# Patient Record
Sex: Female | Born: 1960 | Race: White | Hispanic: No | Marital: Married | Smoking: Never smoker
Health system: Southern US, Community
[De-identification: ages and names within clinical notes are randomized; demographics above are authoritative.]

## PROBLEM LIST (undated history)

## (undated) DIAGNOSIS — N951 Menopausal and female climacteric states: Secondary | ICD-10-CM

## (undated) DIAGNOSIS — I10 Essential (primary) hypertension: Secondary | ICD-10-CM

## (undated) DIAGNOSIS — J189 Pneumonia, unspecified organism: Secondary | ICD-10-CM

## (undated) DIAGNOSIS — K559 Vascular disorder of intestine, unspecified: Secondary | ICD-10-CM

## (undated) DIAGNOSIS — A609 Anogenital herpesviral infection, unspecified: Secondary | ICD-10-CM

## (undated) DIAGNOSIS — M549 Dorsalgia, unspecified: Secondary | ICD-10-CM

## (undated) DIAGNOSIS — M1710 Unilateral primary osteoarthritis, unspecified knee: Secondary | ICD-10-CM

## (undated) DIAGNOSIS — R87619 Unspecified abnormal cytological findings in specimens from cervix uteri: Secondary | ICD-10-CM

## (undated) DIAGNOSIS — E785 Hyperlipidemia, unspecified: Secondary | ICD-10-CM

## (undated) DIAGNOSIS — M1990 Unspecified osteoarthritis, unspecified site: Secondary | ICD-10-CM

## (undated) DIAGNOSIS — IMO0002 Reserved for concepts with insufficient information to code with codable children: Secondary | ICD-10-CM

## (undated) DIAGNOSIS — J309 Allergic rhinitis, unspecified: Secondary | ICD-10-CM

## (undated) DIAGNOSIS — A64 Unspecified sexually transmitted disease: Secondary | ICD-10-CM

## (undated) HISTORY — DX: Hyperlipidemia, unspecified: E78.5

## (undated) HISTORY — DX: Dorsalgia, unspecified: M54.9

## (undated) HISTORY — DX: Unilateral primary osteoarthritis, unspecified knee: M17.10

## (undated) HISTORY — DX: Anogenital herpesviral infection, unspecified: A60.9

## (undated) HISTORY — DX: Reserved for concepts with insufficient information to code with codable children: IMO0002

## (undated) HISTORY — DX: Pneumonia, unspecified organism: J18.9

## (undated) HISTORY — DX: Unspecified osteoarthritis, unspecified site: M19.90

## (undated) HISTORY — DX: Essential (primary) hypertension: I10

## (undated) HISTORY — DX: Unspecified abnormal cytological findings in specimens from cervix uteri: R87.619

## (undated) HISTORY — DX: Unspecified sexually transmitted disease: A64

## (undated) HISTORY — DX: Allergic rhinitis, unspecified: J30.9

## (undated) HISTORY — DX: Vascular disorder of intestine, unspecified: K55.9

## (undated) HISTORY — DX: Menopausal and female climacteric states: N95.1

---

## 1999-04-01 ENCOUNTER — Other Ambulatory Visit: Admission: RE | Admit: 1999-04-01 | Discharge: 1999-04-01 | Payer: Self-pay | Admitting: Obstetrics and Gynecology

## 2000-03-31 ENCOUNTER — Other Ambulatory Visit: Admission: RE | Admit: 2000-03-31 | Discharge: 2000-03-31 | Payer: Self-pay | Admitting: *Deleted

## 2001-03-10 ENCOUNTER — Other Ambulatory Visit: Admission: RE | Admit: 2001-03-10 | Discharge: 2001-03-10 | Payer: Self-pay | Admitting: *Deleted

## 2002-03-21 ENCOUNTER — Other Ambulatory Visit: Admission: RE | Admit: 2002-03-21 | Discharge: 2002-03-21 | Payer: Self-pay | Admitting: Obstetrics and Gynecology

## 2003-03-23 ENCOUNTER — Other Ambulatory Visit: Admission: RE | Admit: 2003-03-23 | Discharge: 2003-03-23 | Payer: Self-pay | Admitting: Obstetrics and Gynecology

## 2003-04-25 ENCOUNTER — Encounter: Admission: RE | Admit: 2003-04-25 | Discharge: 2003-04-25 | Payer: Self-pay | Admitting: Obstetrics and Gynecology

## 2003-09-20 ENCOUNTER — Other Ambulatory Visit: Admission: RE | Admit: 2003-09-20 | Discharge: 2003-09-20 | Payer: Self-pay | Admitting: Obstetrics and Gynecology

## 2004-01-15 ENCOUNTER — Ambulatory Visit: Payer: Self-pay | Admitting: Internal Medicine

## 2004-01-24 ENCOUNTER — Ambulatory Visit: Payer: Self-pay | Admitting: Internal Medicine

## 2004-02-26 ENCOUNTER — Ambulatory Visit: Payer: Self-pay | Admitting: Internal Medicine

## 2004-03-27 ENCOUNTER — Other Ambulatory Visit: Admission: RE | Admit: 2004-03-27 | Discharge: 2004-03-27 | Payer: Self-pay | Admitting: Obstetrics and Gynecology

## 2004-04-25 ENCOUNTER — Encounter: Admission: RE | Admit: 2004-04-25 | Discharge: 2004-04-25 | Payer: Self-pay | Admitting: Obstetrics and Gynecology

## 2004-07-07 ENCOUNTER — Ambulatory Visit: Payer: Self-pay | Admitting: Internal Medicine

## 2004-07-17 ENCOUNTER — Ambulatory Visit: Payer: Self-pay

## 2004-10-06 ENCOUNTER — Other Ambulatory Visit: Admission: RE | Admit: 2004-10-06 | Discharge: 2004-10-06 | Payer: Self-pay | Admitting: Obstetrics and Gynecology

## 2004-10-08 ENCOUNTER — Ambulatory Visit: Payer: Self-pay | Admitting: Internal Medicine

## 2004-12-25 ENCOUNTER — Ambulatory Visit: Payer: Self-pay | Admitting: Internal Medicine

## 2005-01-07 ENCOUNTER — Ambulatory Visit: Payer: Self-pay | Admitting: Internal Medicine

## 2005-03-30 ENCOUNTER — Other Ambulatory Visit: Admission: RE | Admit: 2005-03-30 | Discharge: 2005-03-30 | Payer: Self-pay | Admitting: Obstetrics and Gynecology

## 2005-04-13 ENCOUNTER — Encounter: Admission: RE | Admit: 2005-04-13 | Discharge: 2005-04-13 | Payer: Self-pay | Admitting: Obstetrics and Gynecology

## 2005-07-28 ENCOUNTER — Ambulatory Visit: Payer: Self-pay | Admitting: Internal Medicine

## 2005-08-12 ENCOUNTER — Ambulatory Visit: Payer: Self-pay | Admitting: Internal Medicine

## 2006-01-18 ENCOUNTER — Ambulatory Visit: Payer: Self-pay | Admitting: Internal Medicine

## 2006-01-19 ENCOUNTER — Ambulatory Visit: Payer: Self-pay | Admitting: Internal Medicine

## 2006-01-19 LAB — CONVERTED CEMR LAB
ALT: 22 U/L
AST: 21 U/L
Albumin: 3.7 g/dL
Alkaline Phosphatase: 71 U/L
BUN: 8 mg/dL
Basophils Absolute: 0 10*3/uL
Basophils Relative: 0.7 %
Bilirubin Urine: NEGATIVE
CO2: 30 meq/L
Calcium: 8.9 mg/dL
Chloride: 101 meq/L
Chol/HDL Ratio, serum: 3
Cholesterol: 157 mg/dL
Creatinine, Ser: 1 mg/dL
Eosinophil percent: 2.8 %
GFR calc non Af Amer: 63 mL/min
Glomerular Filtration Rate, Af Am: 76 mL/min/{1.73_m2}
Glucose, Bld: 93 mg/dL
HCT: 38.7 %
HDL: 53.1 mg/dL
Hemoglobin, Urine: NEGATIVE
Hemoglobin: 13.3 g/dL
Ketones, ur: NEGATIVE mg/dL
LDL Cholesterol: 92 mg/dL
Leukocytes, UA: NEGATIVE
Lymphocytes Relative: 22 %
MCHC: 34.3 g/dL
MCV: 91.9 fL
Monocytes Absolute: 1.1 10*3/uL — ABNORMAL HIGH
Monocytes Relative: 15.4 % — ABNORMAL HIGH
Neutro Abs: 4.1 10*3/uL
Neutrophils Relative %: 59.1 %
Nitrite: NEGATIVE
Platelets: 270 10*3/uL
Potassium: 4.1 meq/L
RBC: 4.21 M/uL
RDW: 12.7 %
Sodium: 138 meq/L
Specific Gravity, Urine: <=1.005
TSH: 1.08 u[IU]/mL
Total Bilirubin: 0.7 mg/dL
Total Protein, Urine: NEGATIVE mg/dL
Total Protein: 6.6 g/dL
Triglyceride fasting, serum: 61 mg/dL
Urine Glucose: NEGATIVE mg/dL
Urobilinogen, UA: 0.2
VLDL: 12 mg/dL
WBC: 6.9 10*3/uL
pH: 7

## 2006-04-01 ENCOUNTER — Other Ambulatory Visit: Admission: RE | Admit: 2006-04-01 | Discharge: 2006-04-01 | Payer: Self-pay | Admitting: Obstetrics & Gynecology

## 2006-04-13 LAB — HM MAMMOGRAPHY: HM Mammogram: NORMAL

## 2006-04-16 ENCOUNTER — Encounter: Admission: RE | Admit: 2006-04-16 | Discharge: 2006-04-16 | Payer: Self-pay

## 2006-08-19 ENCOUNTER — Ambulatory Visit: Payer: Self-pay | Admitting: Internal Medicine

## 2006-08-26 ENCOUNTER — Ambulatory Visit: Payer: Self-pay | Admitting: Internal Medicine

## 2006-09-18 ENCOUNTER — Emergency Department (HOSPITAL_COMMUNITY): Admission: EM | Admit: 2006-09-18 | Discharge: 2006-09-18 | Payer: Self-pay | Admitting: Family Medicine

## 2006-11-02 ENCOUNTER — Other Ambulatory Visit: Admission: RE | Admit: 2006-11-02 | Discharge: 2006-11-02 | Payer: Self-pay | Admitting: Obstetrics & Gynecology

## 2006-11-19 ENCOUNTER — Other Ambulatory Visit: Admission: RE | Admit: 2006-11-19 | Discharge: 2006-11-19 | Payer: Self-pay | Admitting: Obstetrics and Gynecology

## 2007-02-02 ENCOUNTER — Telehealth: Payer: Self-pay | Admitting: Internal Medicine

## 2007-03-08 ENCOUNTER — Ambulatory Visit: Payer: Self-pay | Admitting: Internal Medicine

## 2007-03-08 LAB — CONVERTED CEMR LAB
AST: 18 units/L (ref 0–37)
BUN: 8 mg/dL (ref 6–23)
Bilirubin Urine: NEGATIVE
Bilirubin, Direct: 0.1 mg/dL (ref 0.0–0.3)
CO2: 31 meq/L (ref 19–32)
Chloride: 102 meq/L (ref 96–112)
Cholesterol: 166 mg/dL (ref 0–200)
Eosinophils Relative: 1.8 % (ref 0.0–5.0)
Glucose, Bld: 96 mg/dL (ref 70–99)
Hemoglobin, Urine: NEGATIVE
LDL Cholesterol: 103 mg/dL — ABNORMAL HIGH (ref 0–99)
Leukocytes, UA: NEGATIVE
Lymphocytes Relative: 33.3 % (ref 12.0–46.0)
MCHC: 34.9 g/dL (ref 30.0–36.0)
MCV: 91.6 fL (ref 78.0–100.0)
Nitrite: NEGATIVE
Platelets: 277 10*3/uL (ref 150–400)
RBC: 4.1 M/uL (ref 3.87–5.11)
RDW: 13 % (ref 11.5–14.6)
Sodium: 140 meq/L (ref 135–145)
Specific Gravity, Urine: 1.01 (ref 1.000–1.03)
TSH: 0.81 microintl units/mL (ref 0.35–5.50)
Total Bilirubin: 0.6 mg/dL (ref 0.3–1.2)
Total CHOL/HDL Ratio: 3.2
Total Protein: 6.4 g/dL (ref 6.0–8.3)
Triglycerides: 59 mg/dL (ref 0–149)
Urine Glucose: NEGATIVE mg/dL
Urobilinogen, UA: 0.2 (ref 0.0–1.0)
WBC: 5.9 10*3/uL (ref 4.5–10.5)
pH: 7 (ref 5.0–8.0)

## 2007-03-16 ENCOUNTER — Ambulatory Visit: Payer: Self-pay | Admitting: Internal Medicine

## 2007-03-16 DIAGNOSIS — I10 Essential (primary) hypertension: Secondary | ICD-10-CM

## 2007-03-16 DIAGNOSIS — J309 Allergic rhinitis, unspecified: Secondary | ICD-10-CM

## 2007-03-16 HISTORY — DX: Essential (primary) hypertension: I10

## 2007-03-16 HISTORY — DX: Allergic rhinitis, unspecified: J30.9

## 2007-04-20 ENCOUNTER — Encounter: Admission: RE | Admit: 2007-04-20 | Discharge: 2007-04-20 | Payer: Self-pay | Admitting: Obstetrics and Gynecology

## 2007-04-29 ENCOUNTER — Encounter: Admission: RE | Admit: 2007-04-29 | Discharge: 2007-04-29 | Payer: Self-pay | Admitting: Obstetrics and Gynecology

## 2007-05-12 ENCOUNTER — Other Ambulatory Visit: Admission: RE | Admit: 2007-05-12 | Discharge: 2007-05-12 | Payer: Self-pay | Admitting: Obstetrics & Gynecology

## 2007-08-05 ENCOUNTER — Ambulatory Visit: Payer: Self-pay | Admitting: Internal Medicine

## 2007-08-05 DIAGNOSIS — M549 Dorsalgia, unspecified: Secondary | ICD-10-CM | POA: Insufficient documentation

## 2007-08-05 DIAGNOSIS — J069 Acute upper respiratory infection, unspecified: Secondary | ICD-10-CM | POA: Insufficient documentation

## 2007-08-05 HISTORY — DX: Dorsalgia, unspecified: M54.9

## 2007-10-18 ENCOUNTER — Ambulatory Visit: Payer: Self-pay | Admitting: Internal Medicine

## 2008-01-16 ENCOUNTER — Other Ambulatory Visit: Admission: RE | Admit: 2008-01-16 | Discharge: 2008-01-16 | Payer: Self-pay | Admitting: Obstetrics and Gynecology

## 2008-02-27 ENCOUNTER — Ambulatory Visit: Payer: Self-pay | Admitting: Internal Medicine

## 2008-03-12 ENCOUNTER — Telehealth (INDEPENDENT_AMBULATORY_CARE_PROVIDER_SITE_OTHER): Payer: Self-pay | Admitting: *Deleted

## 2008-03-21 ENCOUNTER — Ambulatory Visit: Payer: Self-pay | Admitting: Internal Medicine

## 2008-03-21 ENCOUNTER — Telehealth (INDEPENDENT_AMBULATORY_CARE_PROVIDER_SITE_OTHER): Payer: Self-pay | Admitting: *Deleted

## 2008-03-21 DIAGNOSIS — J209 Acute bronchitis, unspecified: Secondary | ICD-10-CM | POA: Insufficient documentation

## 2008-03-21 LAB — CONVERTED CEMR LAB
BUN: 10 mg/dL (ref 6–23)
Basophils Relative: 0 % (ref 0.0–3.0)
Bilirubin Urine: NEGATIVE
Bilirubin, Direct: 0.1 mg/dL (ref 0.0–0.3)
Calcium: 9.5 mg/dL (ref 8.4–10.5)
Chloride: 102 meq/L (ref 96–112)
Cholesterol: 210 mg/dL (ref 0–200)
Creatinine, Ser: 0.7 mg/dL (ref 0.4–1.2)
Eosinophils Relative: 0.8 % (ref 0.0–5.0)
GFR calc Af Amer: 114 mL/min
GFR calc non Af Amer: 95 mL/min
HCT: 38.6 % (ref 36.0–46.0)
HDL: 57.4 mg/dL (ref >39.0)
Hemoglobin, Urine: NEGATIVE
Hemoglobin: 13.5 g/dL (ref 12.0–15.0)
Ketones, ur: NEGATIVE mg/dL
Leukocytes, UA: NEGATIVE
Lymphocytes Relative: 13.2 % (ref 12.0–46.0)
Monocytes Relative: 3.9 % (ref 3.0–12.0)
Neutro Abs: 9 10*3/uL — ABNORMAL HIGH (ref 1.4–7.7)
Nitrite: NEGATIVE
Platelets: 362 10*3/uL (ref 150–400)
TSH: 0.62 microintl units/mL (ref 0.35–5.50)
Total Protein, Urine: NEGATIVE mg/dL
Total Protein: 7.5 g/dL (ref 6.0–8.3)
Triglycerides: 83 mg/dL (ref 0–149)
Urine Glucose: NEGATIVE mg/dL
Urobilinogen, UA: 0.2 (ref 0.0–1.0)
VLDL: 17 mg/dL (ref 0–40)

## 2008-03-22 LAB — CONVERTED CEMR LAB: Vit D, 1,25-Dihydroxy: 41 (ref 30–89)

## 2008-04-04 ENCOUNTER — Telehealth (INDEPENDENT_AMBULATORY_CARE_PROVIDER_SITE_OTHER): Payer: Self-pay | Admitting: *Deleted

## 2008-04-30 ENCOUNTER — Encounter: Admission: RE | Admit: 2008-04-30 | Discharge: 2008-04-30 | Payer: Self-pay | Admitting: Obstetrics and Gynecology

## 2008-05-25 HISTORY — PX: INTRAUTERINE DEVICE INSERTION: SHX323

## 2008-06-07 ENCOUNTER — Telehealth (INDEPENDENT_AMBULATORY_CARE_PROVIDER_SITE_OTHER): Payer: Self-pay | Admitting: *Deleted

## 2008-06-25 ENCOUNTER — Other Ambulatory Visit: Admission: RE | Admit: 2008-06-25 | Discharge: 2008-06-25 | Payer: Self-pay | Admitting: Gynecology

## 2008-07-02 ENCOUNTER — Ambulatory Visit: Payer: Self-pay | Admitting: Internal Medicine

## 2009-02-25 ENCOUNTER — Ambulatory Visit: Payer: Self-pay | Admitting: Internal Medicine

## 2009-02-25 LAB — CONVERTED CEMR LAB
Alkaline Phosphatase: 35 units/L — ABNORMAL LOW (ref 39–117)
Basophils Absolute: 0 10*3/uL (ref 0.0–0.1)
Basophils Relative: 0.6 % (ref 0.0–3.0)
Bilirubin, Direct: 0.1 mg/dL (ref 0.0–0.3)
CO2: 28 meq/L (ref 19–32)
Chloride: 102 meq/L (ref 96–112)
Creatinine, Ser: 0.8 mg/dL (ref 0.4–1.2)
Eosinophils Absolute: 0.2 10*3/uL (ref 0.0–0.7)
Glucose, Bld: 84 mg/dL (ref 70–99)
HCT: 40.5 % (ref 36.0–46.0)
HDL: 62.5 mg/dL (ref >39.00)
Hemoglobin, Urine: NEGATIVE
Leukocytes, UA: NEGATIVE
Monocytes Relative: 8.6 % (ref 3.0–12.0)
Platelets: 209 10*3/uL (ref 150.0–400.0)
Potassium: 4.2 meq/L (ref 3.5–5.1)
RBC: 4.25 M/uL (ref 3.87–5.11)
RDW: 13.3 % (ref 11.5–14.6)
Sodium: 137 meq/L (ref 135–145)
Total CHOL/HDL Ratio: 3
Urobilinogen, UA: 0.2 (ref 0.0–1.0)
VLDL: 8 mg/dL (ref 0.0–40.0)
pH: 6 (ref 5.0–8.0)

## 2009-03-11 ENCOUNTER — Telehealth: Payer: Self-pay | Admitting: Internal Medicine

## 2009-03-11 ENCOUNTER — Ambulatory Visit: Payer: Self-pay | Admitting: Internal Medicine

## 2009-03-11 DIAGNOSIS — IMO0002 Reserved for concepts with insufficient information to code with codable children: Secondary | ICD-10-CM

## 2009-03-11 DIAGNOSIS — M1710 Unilateral primary osteoarthritis, unspecified knee: Secondary | ICD-10-CM

## 2009-03-11 HISTORY — DX: Reserved for concepts with insufficient information to code with codable children: IMO0002

## 2009-04-17 ENCOUNTER — Encounter (INDEPENDENT_AMBULATORY_CARE_PROVIDER_SITE_OTHER): Payer: Self-pay | Admitting: *Deleted

## 2009-05-02 ENCOUNTER — Encounter: Admission: RE | Admit: 2009-05-02 | Discharge: 2009-05-02 | Payer: Self-pay | Admitting: Obstetrics and Gynecology

## 2009-05-16 ENCOUNTER — Encounter (INDEPENDENT_AMBULATORY_CARE_PROVIDER_SITE_OTHER): Payer: Self-pay | Admitting: *Deleted

## 2009-05-17 ENCOUNTER — Ambulatory Visit: Payer: Self-pay | Admitting: Gastroenterology

## 2009-05-27 ENCOUNTER — Ambulatory Visit: Payer: Self-pay | Admitting: Internal Medicine

## 2009-05-31 ENCOUNTER — Ambulatory Visit: Payer: Self-pay | Admitting: Gastroenterology

## 2009-05-31 LAB — HM COLONOSCOPY

## 2009-10-15 ENCOUNTER — Ambulatory Visit: Payer: Self-pay | Admitting: Internal Medicine

## 2009-10-15 DIAGNOSIS — J019 Acute sinusitis, unspecified: Secondary | ICD-10-CM

## 2009-11-19 ENCOUNTER — Telehealth: Payer: Self-pay | Admitting: Internal Medicine

## 2009-12-17 ENCOUNTER — Ambulatory Visit: Payer: Self-pay | Admitting: Internal Medicine

## 2009-12-17 DIAGNOSIS — L259 Unspecified contact dermatitis, unspecified cause: Secondary | ICD-10-CM | POA: Insufficient documentation

## 2009-12-19 ENCOUNTER — Ambulatory Visit: Payer: Self-pay | Admitting: Internal Medicine

## 2009-12-19 ENCOUNTER — Telehealth: Payer: Self-pay | Admitting: Internal Medicine

## 2009-12-27 ENCOUNTER — Telehealth: Payer: Self-pay | Admitting: Internal Medicine

## 2009-12-27 DIAGNOSIS — R21 Rash and other nonspecific skin eruption: Secondary | ICD-10-CM | POA: Insufficient documentation

## 2010-02-21 ENCOUNTER — Ambulatory Visit: Payer: Self-pay | Admitting: Internal Medicine

## 2010-02-25 ENCOUNTER — Telehealth: Payer: Self-pay | Admitting: Internal Medicine

## 2010-03-10 ENCOUNTER — Ambulatory Visit: Payer: Self-pay | Admitting: Internal Medicine

## 2010-03-10 DIAGNOSIS — I10 Essential (primary) hypertension: Secondary | ICD-10-CM

## 2010-03-12 ENCOUNTER — Other Ambulatory Visit: Payer: Self-pay | Admitting: Internal Medicine

## 2010-03-12 LAB — URINALYSIS, ROUTINE W REFLEX MICROSCOPIC
Bilirubin Urine: NEGATIVE
Hemoglobin, Urine: NEGATIVE
Ketones, ur: NEGATIVE
Nitrite: NEGATIVE
Specific Gravity, Urine: <=1.005 (ref 1.000–1.030)
Total Protein, Urine: NEGATIVE
Urine Glucose: NEGATIVE
Urobilinogen, UA: 0.2 (ref 0.0–1.0)
pH: 6.5 (ref 5.0–8.0)

## 2010-03-12 LAB — B12 AND FOLATE PANEL
Folate: 18.8 ng/mL
Vitamin B-12: >1500 pg/mL — ABNORMAL HIGH (ref 211–911)

## 2010-03-12 LAB — CBC WITH DIFFERENTIAL/PLATELET
Basophils Absolute: 0 10*3/uL (ref 0.0–0.1)
Basophils Relative: 0.5 % (ref 0.0–3.0)
Eosinophils Absolute: 0.2 10*3/uL (ref 0.0–0.7)
Eosinophils Relative: 3.1 % (ref 0.0–5.0)
HCT: 38.1 % (ref 36.0–46.0)
Hemoglobin: 13.1 g/dL (ref 12.0–15.0)
Lymphocytes Relative: 33.2 % (ref 12.0–46.0)
Lymphs Abs: 1.6 10*3/uL (ref 0.7–4.0)
MCHC: 34.5 g/dL (ref 30.0–36.0)
MCV: 92.3 fl (ref 78.0–100.0)
Monocytes Absolute: 0.5 10*3/uL (ref 0.1–1.0)
Monocytes Relative: 9.7 % (ref 3.0–12.0)
Neutro Abs: 2.7 10*3/uL (ref 1.4–7.7)
Neutrophils Relative %: 53.5 % (ref 43.0–77.0)
Platelets: 297 10*3/uL (ref 150.0–400.0)
RBC: 4.13 Mil/uL (ref 3.87–5.11)
RDW: 13.8 % (ref 11.5–14.6)
WBC: 5 10*3/uL (ref 4.5–10.5)

## 2010-03-12 LAB — BASIC METABOLIC PANEL
BUN: 14 mg/dL (ref 6–23)
CO2: 30 mEq/L (ref 19–32)
Calcium: 9.1 mg/dL (ref 8.4–10.5)
Chloride: 101 mEq/L (ref 96–112)
Creatinine, Ser: 0.7 mg/dL (ref 0.4–1.2)
GFR: 89.23 mL/min (ref >60.00)
Glucose, Bld: 78 mg/dL (ref 70–99)
Potassium: 4.2 mEq/L (ref 3.5–5.1)
Sodium: 138 mEq/L (ref 135–145)

## 2010-03-12 LAB — HEPATIC FUNCTION PANEL
ALT: 25 U/L (ref 0–35)
AST: 23 U/L (ref 0–37)
Albumin: 3.8 g/dL (ref 3.5–5.2)
Alkaline Phosphatase: 50 U/L (ref 39–117)
Bilirubin, Direct: 0.1 mg/dL (ref 0.0–0.3)
Total Bilirubin: 0.8 mg/dL (ref 0.3–1.2)
Total Protein: 6.7 g/dL (ref 6.0–8.3)

## 2010-03-12 LAB — LIPID PANEL
Cholesterol: 171 mg/dL (ref 0–200)
HDL: 43.4 mg/dL (ref >39.00)
LDL Cholesterol: 121 mg/dL — ABNORMAL HIGH (ref 0–99)
Total CHOL/HDL Ratio: 4
Triglycerides: 34 mg/dL (ref 0.0–149.0)
VLDL: 6.8 mg/dL (ref 0.0–40.0)

## 2010-03-12 LAB — TSH: TSH: 0.51 u[IU]/mL (ref 0.35–5.50)

## 2010-03-17 ENCOUNTER — Encounter: Payer: Self-pay | Admitting: Internal Medicine

## 2010-03-17 ENCOUNTER — Ambulatory Visit
Admission: RE | Admit: 2010-03-17 | Discharge: 2010-03-17 | Payer: Self-pay | Source: Home / Self Care | Attending: Internal Medicine | Admitting: Internal Medicine

## 2010-03-17 DIAGNOSIS — E785 Hyperlipidemia, unspecified: Secondary | ICD-10-CM | POA: Insufficient documentation

## 2010-03-17 HISTORY — DX: Hyperlipidemia, unspecified: E78.5

## 2010-03-30 ENCOUNTER — Encounter: Payer: Self-pay | Admitting: Obstetrics and Gynecology

## 2010-04-10 NOTE — Progress Notes (Signed)
  Phone Note Call from Patient Call back at Work Phone [phone]   Caller: Patient Summary of Call: Pt c/o sinus congestion and pain. She states she is taking allergy medication and also Benadryl with no relieft. She is wondering if something can be called in for her since she is having the same symptoms as last OV (10/15/09) so she doesn't have to come in. Initial call taken by: Brenton Grills MA,  November 19, 2009 10:25 AM  Follow-up for Phone Call        ok this time = done per emr Follow-up by: Corwin Levins MD,  November 19, 2009 1:23 PM  Additional Follow-up for Phone Call Additional follow up Details #1::        pt informed Additional Follow-up by: Brenton Grills MA,  November 19, 2009 1:30 PM    New/Updated Medications: CLARITHROMYCIN 500 MG TABS (CLARITHROMYCIN) 1 by mouth two times a day Prescriptions: CLARITHROMYCIN 500 MG TABS (CLARITHROMYCIN) 1 by mouth two times a day  #20 x 0   Entered and Authorized by:   Corwin Levins MD   Signed by:   Corwin Levins MD on 11/19/2009   Method used:   Electronically to        [facility]  [address] # [id number]* (retail)       [address]       [place], [place]  [address]       Ph: [phone] or [phone]       Fax: [phone]   RxID:   [id number]

## 2010-04-10 NOTE — Progress Notes (Signed)
  Phone Note Refill Request   Refills Requested: Medication #1:  FEXOFENADINE HCL 180 MG TABS 1po once daily as needed allergy   Dosage confirmed as above?Dosage Confirmed   Notes: [facility] Initial call taken by: Scharlene Gloss,  March 11, 2009 12:11 PM    Prescriptions: FEXOFENADINE HCL 180 MG TABS (FEXOFENADINE HCL) 1po once daily as needed allergy  #90 x 3   Entered by:   Scharlene Gloss   Authorized by:   Corwin Levins MD   Signed by:   Scharlene Gloss on 03/11/2009   Method used:   Faxed to ...       [facility]* (mail-order)             ,          Ph: [phone]       Fax: [phone]   RxID:   [id number]

## 2010-04-10 NOTE — Miscellaneous (Signed)
Summary: [facility]  Clinical Lists Changes  Medications: Added new medication of MOVIPREP 100 GM  SOLR (PEG-KCL-NACL-NASULF-NA ASC-C) As per prep instructions. - Signed Rx of MOVIPREP 100 GM  SOLR (PEG-KCL-NACL-NASULF-NA ASC-C) As per prep instructions.;  #1 x 0;  Signed;  Entered by: Ezra Sites RN;  Authorized by: Mardella Layman MD [facility];  Method used: Electronically to [facility] [address] # [id number]*, [address] [place], [place], [place]  [address], Ph: [phone] or [phone], Fax: [phone] Allergies: Added new allergy or adverse reaction of SEPTRA    Prescriptions: MOVIPREP 100 GM  SOLR (PEG-KCL-NACL-NASULF-NA ASC-C) As per prep instructions.  #1 x 0   Entered by:   Ezra Sites RN   Authorized by:   Mardella Layman MD [facility]   Signed by:   Ezra Sites RN on 05/17/2009   Method used:   Electronically to        [facility] [address] # [facility]* (retail)       [address]       [place], [place]  [address]       Ph: [phone] or [phone]       Fax: [phone]   RxID:   [id number]

## 2010-04-10 NOTE — Assessment & Plan Note (Signed)
Summary: CPX/[facility]/#/CD   Vital Signs:  Patient profile:   50 year old female Height:      65 inches Weight:      151 pounds BMI:     25.22 O2 Sat:      98 % on Room air Temp:     96.9 degrees F oral Pulse rate:   74 / minute BP sitting:   122 / 74  (left arm) Cuff size:   regular  Vitals Entered ByMarland Kitchen Zella Ball Ewing (March 11, 2009 8:33 AM)  O2 Flow:  Room air  CC: adult physical/Re   Primary Care Provider:  Corwin Levins MD  CC:  adult physical/Re.  History of Present Illness: overall doing very well, has gained just a few lbs in the last 2 months with somewhat less active than usual, but Pt denies CP, sob, doe, wheezing, orthopnea, pnd, worsening LE edema, palps, dizziness or syncope   Pt denies new neuro symptoms such as headache, facial or extremity weakness   Overall good compliance with meds, and tolerating well.    Problems Prior to Update: 1)  Asthmatic Bronchitis, Acute  (ICD-466.0) 2)  Preventive Health Care  (ICD-V70.0) 3)  Back Pain  (ICD-724.5) 4)  Uri  (ICD-465.9) 5)  Hypertension  (ICD-401.9) 6)  Allergic Rhinitis  (ICD-477.9) 7)  Preventive Health Care  (ICD-V70.0) 8)  Routine General Medical Exam@health  Care Facl  (ICD-V70.0)  Medications Prior to Update: 1)  Benicar 40 Mg Tabs (Olmesartan Medoxomil) .... Once Daily 2)  Estrace 1 Mg  Tabs (Estradiol) .Marland Kitchen.. 1 By Mouth Qd 3)  Ecotrin Low Strength 81 Mg  Tbec (Aspirin) .Marland Kitchen.. 1 By Mouth Qd 4)  Daily Multiple Vitamins   Tabs (Multiple Vitamin) .Marland Kitchen.. 1 By Mouth Qd 5)  Cetirizine Hcl 10 Mg  Tabs (Cetirizine Hcl) .Marland Kitchen.. 1 By Mouth Once Daily 6)  Nasonex 50 Mcg/act Susp (Mometasone Furoate) .... 2 Puffs Each Nostril Once Daily  Current Medications (verified): 1)  Benicar 40 Mg Tabs (Olmesartan Medoxomil) .... 1/2 Once Daily 2)  Estrace 1 Mg  Tabs (Estradiol) .Marland Kitchen.. 1 By Mouth Qd 3)  Ecotrin Low Strength 81 Mg  Tbec (Aspirin) .Marland Kitchen.. 1 By Mouth Qd 4)  Daily Multiple Vitamins   Tabs (Multiple Vitamin) .Marland Kitchen.. 1 By Mouth  Qd 5)  Fexofenadine Hcl 180 Mg Tabs (Fexofenadine Hcl) .Marland Kitchen.. 1po Once Daily As Needed Allergy 6)  Nasonex 50 Mcg/act Susp (Mometasone Furoate) .... 2 Puffs Each Nostril Once Daily 7)  Glucosamine 500 Mg Caps (Glucosamine Sulfate) .Marland Kitchen.. 1 By Mouth Once Daily  Allergies (verified): 1)  ! Sulfa  Past History:  Past Surgical History: Last updated: 03/16/2007 Denies surgical history  Family History: Last updated: 03/16/2007 sister with brain tumor 3 sister with HTN mother with heart murmur, DM mother with glaucoma  Social History: Last updated: 03/16/2007 Never Smoked Alcohol use-yes Married i child work -Building control surveyor for [employer]  Risk Factors: Smoking Status: never (03/16/2007)  Past Medical History: Allergic rhinitis migraine menopausal since 50yo Hypertension  Review of Systems  The patient denies anorexia, fever, weight loss, weight gain, vision loss, decreased hearing, hoarseness, chest pain, syncope, dyspnea on exertion, peripheral edema, prolonged cough, headaches, hemoptysis, abdominal pain, melena, hematochezia, severe indigestion/heartburn, hematuria, incontinence, muscle weakness, suspicious skin lesions, difficulty walking, depression, unusual weight change, abnormal bleeding, enlarged lymph nodes, and angioedema.         all otherwise negative per pt , has some occainal knee joint popping, and left ear eustachan valve  symtpom with pressure type discomfort wihtout hearing loss or vertigo; used to take allegra  Physical Exam  General:  alert and well-developed.   Head:  normocephalic and atraumatic.   Eyes:  vision grossly intact and pupils equal.   Ears:  left tm mild erythema, non bulging, right TM ok Nose:  nasal dischargemucosal pallor and mucosal erythema.   Mouth:  no gingival abnormalities and pharynx pink and moist.   Neck:  supple and no masses.   Lungs:  normal respiratory effort and normal breath sounds.   Heart:  normal rate and regular  rhythm.   Abdomen:  soft, non-tender, and normal bowel sounds.   Msk:  no joint tenderness and no joint swelling.  except for very mild left knee warmth and crepitus ;  has normal ROM and NT, no effusion Extremities:  no edema, no erythema  Neurologic:  cranial nerves II-XII intact and strength normal in all extremities.     Impression & Recommendations:  Problem # 1:  Preventive Health Care (ICD-V70.0)  Overall doing well, up to date, counseled on routine health concerns for screening and prevention, immunizations up to date or declined, labs reviewed, ecg reviewed  Orders: EKG w/ Interpretation (93000) Gastroenterology Referral (GI)  Problem # 2:  HYPERTENSION (ICD-401.9)  Her updated medication list for this problem includes:    Benicar 40 Mg Tabs (Olmesartan medoxomil) .Marland Kitchen... 1/2 once daily stable overall by hx and exam, ok to continue meds/tx as is   Problem # 3:  ALLERGIC RHINITIS (ICD-477.9)  Her updated medication list for this problem includes:    Fexofenadine Hcl 180 Mg Tabs (Fexofenadine hcl) .Marland Kitchen... 1po once daily as needed allergy    Nasonex 50 Mcg/act Susp (Mometasone furoate) .Marland Kitchen... 2 puffs each nostril once daily treat as above, f/u any worsening signs or symptoms   Complete Medication List: 1)  Benicar 40 Mg Tabs (Olmesartan medoxomil) .... 1/2 once daily 2)  Estrace 1 Mg Tabs (Estradiol) .Marland Kitchen.. 1 by mouth qd 3)  Ecotrin Low Strength 81 Mg Tbec (Aspirin) .Marland Kitchen.. 1 by mouth qd 4)  Daily Multiple Vitamins Tabs (Multiple vitamin) .Marland Kitchen.. 1 by mouth qd 5)  Fexofenadine Hcl 180 Mg Tabs (Fexofenadine hcl) .Marland Kitchen.. 1po once daily as needed allergy 6)  Nasonex 50 Mcg/act Susp (Mometasone furoate) .... 2 puffs each nostril once daily 7)  Glucosamine 500 Mg Caps (Glucosamine sulfate) .Marland Kitchen.. 1 by mouth once daily  Other Orders: Tdap => 3yrs IM (82956) Admin 1st Vaccine (21308)  Patient Instructions: 1)  you had the tetanus shot today 2)  Please take all new medications as  prescribed 3)  Continue all previous medications as before this visit  4)  You will be contacted about the referral(s) to: Colonoscopy 5)  Please schedule a follow-up appointment in 1 year or sooner if needed Prescriptions: FEXOFENADINE HCL 180 MG TABS (FEXOFENADINE HCL) 1po once daily as needed allergy  #90 x 3   Entered and Authorized by:   Corwin Levins MD   Signed by:   Corwin Levins MD on 03/11/2009   Method used:   Print then Give to Patient   RxID:   [id number] BENICAR 40 MG TABS (OLMESARTAN MEDOXOMIL) once daily  #90 x 3   Entered and Authorized by:   Corwin Levins MD   Signed by:   Corwin Levins MD on 03/11/2009   Method used:   Print then Give to Patient   RxID:   [id number] BENICAR 40 MG TABS (OLMESARTAN  MEDOXOMIL) once daily  #90 x 3   Entered and Authorized by:   Corwin Levins MD   Signed by:   Corwin Levins MD on 03/11/2009   Method used:   Print then Give to Patient   RxID:   [id number]    Immunizations Administered:  Tetanus Vaccine:    Vaccine Type: Tdap    Site: right deltoid    Mfr: GlaxoSmithKline    Dose: 0.5 ml    Route: IM    Given by: Robin Ewing    Exp. Date: 05/04/2011    Lot #: [id number]    VIS given: 01/25/07 version given March 11, 2009.

## 2010-04-10 NOTE — Assessment & Plan Note (Signed)
Summary: SINUS HEADACHE/CD   Vital Signs:  Patient profile:   50 year old female Height:      64 inches Weight:      147 pounds BMI:     25.32 O2 Sat:      96 % on Room air Temp:     98.5 degrees F oral Pulse rate:   78 / minute BP sitting:   108 / 72  (left arm) Cuff size:   regular  Vitals Entered By: Zella Ball Ewing CMA (AAMA) (October 15, 2009 3:30 PM)  O2 Flow:  Room air CC: Sinus headache for 5 days/RE   Primary Care Provider:  Corwin Levins MD  CC:  Sinus headache for 5 days/RE.  History of Present Illness: here with acute onset moderate to severe x 5 days fever, left facial pain, pressure, fever and greenish d/c and left earache as well; no sT, cough and Pt denies CP, sob, doe, wheezing, orthopnea, pnd, worsening LE edema, palps, dizziness or syncope Pt denies new neuro symptoms such as headache, facial or extremity weakness  .  This is on top of nasal allergy symtpoms usually better with benadryl at bedtime as needed for 2 mo since she ran out of her allegra which worked well previously.  Mucinex does help somewhat in the last few days.  Overall good med compliacne as well, good tolerance.    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 1/2 Once Daily 2)  Estrace 1 Mg  Tabs (Estradiol) .Marland Kitchen.. 1 By Mouth Qd 3)  Ecotrin Low Strength 81 Mg  Tbec (Aspirin) .Marland Kitchen.. 1 By Mouth Qd 4)  Daily Multiple Vitamins   Tabs (Multiple Vitamin) .Marland Kitchen.. 1 By Mouth Qd 5)  Fexofenadine Hcl 180 Mg Tabs (Fexofenadine Hcl) .Marland Kitchen.. 1po Once Daily As  Needed Allergy 6)  Nasonex 50 Mcg/act Susp (Mometasone Furoate) .... 2 Puffs Each Nostril Once Daily 7)  Glucosamine 500 Mg Caps (Glucosamine Sulfate) .Marland Kitchen.. 1 By Mouth Once Daily 8)  Azithromycin 250 Mg Tabs (Azithromycin) .... 2po Qd For 1 Day, Then 1po Qd For 4days, Then Stop  Current Medications (verified): 1)  Benicar 40 Mg Tabs (Olmesartan Medoxomil) .... 1/2 Once Daily 2)  Estrace 1 Mg  Tabs (Estradiol) .Marland Kitchen.. 1 By Mouth Qd 3)  Ecotrin Low Strength 81 Mg  Tbec (Aspirin) .Marland Kitchen.. 1 By Mouth Qd 4)  Daily Multiple Vitamins   Tabs (Multiple Vitamin) .Marland Kitchen.. 1 By Mouth Qd 5)  Fexofenadine Hcl 180 Mg Tabs (Fexofenadine Hcl) .Marland Kitchen.. 1po Once Daily As Needed Allergy 6)  Nasonex 50 Mcg/act Susp (Mometasone Furoate) .... 2 Puffs Each Nostril Once Daily 7)  Glucosamine 500 Mg Caps (Glucosamine Sulfate) .Marland Kitchen.. 1 By Mouth Once Daily 8)  Levaquin 500 Mg Tabs (Levofloxacin) .Marland Kitchen.. 1po Once Daily  Allergies (verified): 1)  ! Sulfa 2)  ! Septra  Past History:  Past Medical History: Last updated: 03/11/2009 Allergic rhinitis migraine menopausal since 50yo Hypertension  Past Surgical History: Last updated: 03/16/2007 Denies surgical history  Social History: Last updated: 03/16/2007 Never Smoked Alcohol  use-yes Married i child work -Building control surveyor for [employer]  Risk Factors: Smoking Status: never (03/16/2007)  Review of Systems       all otherwise negative per pt -    Physical Exam  General:  alert and well-developed.   Head:  normocephalic and atraumatic.   Eyes:  vision grossly intact, pupils equal, and pupils round.   Ears:  R ear normal.  ., left tm mod erythema, sinus tender left > right with puffiness Nose:  nasal dischargemucosal pallor and mucosal edema.   Mouth:  pharyngeal erythema and fair dentition.   Neck:  supple and no masses.   Lungs:  normal respiratory effort and normal breath sounds.   Heart:  normal rate and regular rhythm.   Extremities:  no edema, no erythema      Impression & Recommendations:  Problem # 1:  SINUSITIS- ACUTE-NOS (ICD-461.9)  Her updated medication list for this problem includes:    Nasonex 50 Mcg/act Susp (Mometasone furoate) .Marland Kitchen... 2 puffs each nostril once daily    Levaquin 500 Mg Tabs (Levofloxacin) .Marland Kitchen... 1po once daily treat as above, f/u any worsening signs or symptoms   Problem # 2:  ALLERGIC RHINITIS (ICD-477.9)  Her updated medication list for this problem includes:    Fexofenadine Hcl 180 Mg Tabs (Fexofenadine hcl) .Marland Kitchen... 1po once daily as needed allergy    Nasonex 50 Mcg/act Susp (Mometasone furoate) .Marland Kitchen... 2 puffs each nostril once daily treat as above, f/u any worsening signs or symptoms  - to re-start meds  Problem # 3:  HYPERTENSION (ICD-401.9)  Her updated medication list for this problem includes:    Benicar 40 Mg Tabs (Olmesartan medoxomil) .Marland Kitchen... 1/2 once daily  BP today: 108/72 Prior BP: 118/60 (05/27/2009)  Prior 10 Yr Risk Heart Disease: Not enough information (07/02/2008)  Labs Reviewed: K+: 4.2 (02/25/2009) Creat: : 0.8 (02/25/2009)   Chol: 175 (02/25/2009)   HDL: 62.50 (02/25/2009)   LDL: 105 (02/25/2009)   TG: 40.0 (02/25/2009) stable overall by hx and exam, ok to continue meds/tx as is   Complete Medication List: 1)  Benicar 40 Mg Tabs (Olmesartan medoxomil) .... 1/2 once daily 2)  Estrace 1 Mg Tabs (Estradiol) .Marland Kitchen.. 1 by mouth qd 3)  Ecotrin Low Strength 81 Mg Tbec (Aspirin) .Marland Kitchen.. 1 by mouth qd 4)  Daily Multiple Vitamins Tabs (Multiple vitamin) .Marland Kitchen.. 1 by mouth qd 5)  Fexofenadine Hcl 180 Mg Tabs (Fexofenadine hcl) .Marland Kitchen.. 1po once daily as needed allergy 6)  Nasonex 50 Mcg/act Susp (Mometasone furoate) .... 2 puffs each nostril once daily 7)  Glucosamine 500 Mg Caps (Glucosamine sulfate) .Marland Kitchen.. 1 by mouth once daily 8)  Levaquin 500 Mg Tabs (Levofloxacin) .Marland Kitchen.. 1po once daily  Patient Instructions: 1)  Please take all new medications as prescribed 2)  Continue all previous medications as before  this visit  3)  You can also use Mucinex OTC or it's generic for congestion  4)  Please schedule a follow-up appointment as needed. Prescriptions: FEXOFENADINE HCL 180 MG TABS (FEXOFENADINE HCL) 1po once daily as needed allergy  #90 x 3   Entered and Authorized by:   Corwin Levins MD   Signed by:   Corwin Levins MD on 10/15/2009   Method used:   Print then Give to Patient   RxID:   [id number] LEVAQUIN 500 MG TABS (LEVOFLOXACIN) 1po once daily  #14 x 0   Entered and Authorized by:   Corwin Levins MD   Signed by:  Corwin Levins MD on 10/15/2009   Method used:   Print then Give to Patient   RxID:   [id number]

## 2010-04-10 NOTE — Procedures (Signed)
Summary: Colonoscopy  Patient: Alyssa Shaw Note: All result statuses are Final unless otherwise noted.  Tests: (1) Colonoscopy (COL)   COL Colonoscopy           DONE     [facility]     [address]     [place], [place]  [address]           COLONOSCOPY PROCEDURE REPORT           PATIENT:  Alyssa, Shaw  MR#:  [id number]     BIRTHDATE:  27-Dec-1960, 48 yrs. old  GENDER:  female     ENDOSCOPIST:  Vania Rea. Jarold Motto, MD, [facility]     REF. BY:  Oliver Barre, M.D.     PROCEDURE DATE:  05/31/2009     PROCEDURE:  Average-risk screening colonoscopy     G0121     ASA CLASS:  Class II     INDICATIONS:  Routine Risk Screening     MEDICATIONS:   Fentanyl 75 mcg IV, Versed 8 mg IV           DESCRIPTION OF PROCEDURE:   After the risks benefits and     alternatives of the procedure were thoroughly explained, informed     consent was obtained.  Digital rectal exam was performed and     revealed no abnormalities.   The LB [id number] endoscope     was introduced through the anus and advanced to the cecum, which     was identified by both the appendix and ileocecal valve, without     limitations.  The quality of the prep was excellent, using     MoviPrep.  The instrument was then slowly withdrawn as the colon     was fully examined.     <<PROCEDUREIMAGES>>           FINDINGS:  No polyps or cancers were seen.  This was otherwise a     normal examination of the colon.   Retroflexed views in the rectum     revealed no abnormalities.    The scope was then withdrawn from     the patient and the procedure completed.           COMPLICATIONS:  None     ENDOSCOPIC IMPRESSION:     1) No polyps or cancers     2) Otherwise normal examination     RECOMMENDATIONS:     1) Continue current colorectal screening recommendations for     "routine risk" patients with a repeat colonoscopy in 10 years.     REPEAT EXAM:  No           ______________________________     Vania Rea. Jarold Motto, MD,  Clement[facility] Graham           CC:           n.     eSIGNED:   Vania Rea. My [place]ari[place] at 05/31/2009 10:06 AM           Therisa Doyne, [id number]  Note: An exclamation mark (!) indicates a result that was not dispersed into the flowsheet. Document Creation Date: 05/31/2009 12:51 PM _______________________________________________________________________  (1) Order result status: Final Collection or observation date-time: 05/31/2009 10:02 Requested date-time:  Receipt date-time:  Reported date-time:  Referring Physician:   Ordering Physician: Sheryn Bison [phone]) Specimen Source:  Source: Launa Grill Order Number: [id number] Lab site:   Appended Document: Colonoscopy    Clinical Lists Changes  Observations: Added new observation of COLONNXTDUE:  05/2019 (05/31/2009 13:15)     

## 2010-04-10 NOTE — Assessment & Plan Note (Signed)
Summary: ?poison ivy/sumac?/cd   Vital Signs:  Patient profile:   50 year old female Height:      64 inches Weight:      160.38 pounds BMI:     27.63 O2 Sat:      96 % on Room air Temp:     98 degrees F oral Pulse rate:   70 / minute BP sitting:   118 / 78  (left arm) Cuff size:   regular  Vitals Entered By: Zella Ball Ewing CMA Duncan Dull) (December 17, 2009 3:39 PM)  O2 Flow:  Room air CC: Poison Ivy/RE   Primary Care Provider:  Corwin Levins MD  CC:  Poison Ivy/RE.  History of Present Illness: here for acute, with itchy rash to relatively small area to the right forearm and area near the left face and eye after pulling weeds in the yard over the weekend;  has occurred before doing the same thing in years past;  Pt denies CP, worsening sob, doe, wheezing, orthopnea, pnd, worsening LE edema, palps, dizziness or syncope  No fever, wt loss, night sweats, loss of appetite or other constitutional symptoms  No blurred visiion or eyepain or sweling. Pt denies new neuro symptoms such as headache, facial or extremity weakness  Pt denies polydipsia, polyuria.  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 1/2 Once Daily 2)  Estrace 1 Mg  Tabs (Estradiol) .Marland Kitchen.. 1 By Mouth Qd 3)  Ecotrin Low Strength 81 Mg  Tbec (Aspirin) .Marland Kitchen.. 1 By Mouth Qd 4)  Daily Multiple Vitamins   Tabs (Multiple Vitamin) .Marland Kitchen.. 1 By Mouth Qd 5)  Fexofenadine Hcl 180 Mg Tabs (Fexofenadine Hcl) .Marland Kitchen.. 1po Once Daily As Needed Allergy 6)  Nasonex 50 Mcg/act Susp (Mometasone  Furoate) .... 2 Puffs Each Nostril Once Daily 7)  Glucosamine 500 Mg Caps (Glucosamine Sulfate) .Marland Kitchen.. 1 By Mouth Once Daily 8)  Clarithromycin 500 Mg Tabs (Clarithromycin) .Marland Kitchen.. 1 By Mouth Two Times A Day  Current Medications (verified): 1)  Benicar 40 Mg Tabs (Olmesartan Medoxomil) .... 1/2 Once Daily 2)  Estrace 1 Mg  Tabs (Estradiol) .Marland Kitchen.. 1 By Mouth Qd 3)  Ecotrin Low Strength 81 Mg  Tbec (Aspirin) .Marland Kitchen.. 1 By Mouth Qd 4)  Daily Multiple Vitamins   Tabs (Multiple Vitamin) .Marland Kitchen.. 1 By Mouth Qd 5)  Fexofenadine Hcl 180 Mg Tabs (Fexofenadine Hcl) .Marland Kitchen.. 1po Once Daily As Needed Allergy 6)  Nasonex 50 Mcg/act Susp (Mometasone Furoate) .... 2 Puffs Each Nostril Once Daily 7)  Glucosamine 500 Mg Caps (Glucosamine Sulfate) .Marland Kitchen.. 1 By Mouth Once Daily 8)  Clarithromycin 500 Mg Tabs (Clarithromycin) .Marland Kitchen.. 1 By Mouth Two Times A Day 9)  Triamcinolone Acetonide 0.1 % Crea (Triamcinolone Acetonide) .... Use Asd Two Times A Day As Needed  Allergies (verified): 1)  ! Sulfa 2)  ! Septra  Past History:  Past Medical History: Last updated: 03/11/2009 Allergic rhinitis migraine menopausal since 50yo Hypertension  Past Surgical History: Last updated: 03/16/2007 Denies surgical history  Social History: Last updated: 03/16/2007 Never Smoked Alcohol use-yes  Married i child work -Building control surveyor for [employer]  Risk Factors: Smoking Status: never (03/16/2007)  Review of Systems       all otherwise negative per pt -    Physical Exam  General:  alert and well-developed.   Head:  normocephalic and atraumatic.   Eyes:  vision grossly intact, pupils equal, and pupils round.   Ears:  R ear normal and L ear normal.   Nose:  no external deformity and no nasal discharge.   Mouth:  no gingival abnormalities and pharynx pink and moist.   Neck:  supple and no masses.   Lungs:  normal respiratory effort and normal breath sounds.   Heart:  normal rate and regular rhythm.   Extremities:  no edema,  no erythema  Skin:  typical contat dermatitis rash to anterior right forearm and left face near the left eye but not involving this Psych:  not anxious appearing and not depressed appearing.     Impression & Recommendations:  Problem # 1:  CONTACT DERMATITIS (ICD-692.9)  Her updated medication list for this problem includes:    Fexofenadine Hcl 180 Mg Tabs (Fexofenadine hcl) .Marland Kitchen... 1po once daily as needed allergy    Triamcinolone Acetonide 0.1 % Crea (Triamcinolone acetonide) ..... Use asd two times a day as needed treat as above, f/u any worsening signs or symptoms , overall mild  Problem # 2:  HYPERTENSION (ICD-401.9)  Her updated medication list for this problem includes:    Benicar 40 Mg Tabs (Olmesartan medoxomil) .Marland Kitchen... 1/2 once daily  BP today: 118/78 Prior BP: 108/72 (10/15/2009)  Prior 10 Yr Risk Heart Disease: Not enough information (07/02/2008)  Labs Reviewed: K+: 4.2 (02/25/2009) Creat: : 0.8 (02/25/2009)   Chol: 175 (02/25/2009)   HDL: 62.50 (02/25/2009)   LDL: 105 (02/25/2009)   TG: 40.0 (02/25/2009) stable overall by hx and exam, ok to continue meds/tx as is   Complete Medication List: 1)  Benicar 40 Mg Tabs (Olmesartan medoxomil) .... 1/2 once daily 2)  Estrace 1 Mg Tabs (Estradiol) .Marland Kitchen.. 1 by mouth qd 3)  Ecotrin Low Strength 81 Mg Tbec (Aspirin) .Marland Kitchen.. 1 by mouth qd 4)  Daily Multiple Vitamins Tabs (Multiple vitamin) .Marland Kitchen.. 1 by mouth qd 5)  Fexofenadine Hcl 180 Mg Tabs (Fexofenadine hcl) .Marland Kitchen.. 1po once daily as needed allergy 6)  Nasonex 50 Mcg/act Susp (Mometasone furoate) .... 2 puffs each nostril once daily 7)  Glucosamine 500 Mg Caps (Glucosamine sulfate) .Marland Kitchen.. 1 by mouth once daily 8)  Clarithromycin 500 Mg Tabs (Clarithromycin) .Marland Kitchen.. 1 by mouth two times a day 9)  Triamcinolone Acetonide 0.1 % Crea (Triamcinolone acetonide) .... Use asd two times a day as needed  Patient Instructions: 1)  Please take all new medications as prescribed 2)  Continue all previous  medications as before this visit 3)  Please schedule a follow-up appointment in Jan 2012 with CPX labs and B12;folate:  780.79 Prescriptions: TRIAMCINOLONE ACETONIDE 0.1 % CREA (TRIAMCINOLONE ACETONIDE) use asd two times a day as needed  #1large tube x 1   Entered and Authorized by:   Corwin Levins MD   Signed by:   Corwin Levins MD on 12/17/2009   Method used:   Print then Give to Patient   RxID:   [id number]

## 2010-04-10 NOTE — Assessment & Plan Note (Signed)
Summary: BURNING/EARS/THROAT---CHEST WHEEZING---STC   Vital Signs:  Patient profile:   50 year old female Height:      64 inches Weight:      162.13 pounds BMI:     27.93 O2 Sat:      96 % on Room air Temp:     98.5 degrees F oral Pulse rate:   92 / minute BP sitting:   102 / 72  (left arm) Cuff size:   regular  Vitals Entered By: Zella Ball Ewing CMA Duncan Dull) (February 21, 2010 2:46 PM)  O2 Flow:  Room air CC: Wheezing, cough, throat and ear pain/RE   Primary Care Sammuel Blick:  Corwin Levins MD  CC:  Wheezing, cough, and throat and ear pain/RE.  History of Present Illness: here today with c/o acute onset midl to mod 3 days facial pain., pressure, fever, headache,  and greenish d/c, with mild st, chest congestion adn mid upper chest wheezing, but no difficutly with deep breaths, and Pt denies CP, worsening sob, doe, orthopnea, pnd, worsening LE edema, palps, dizziness or syncope .  Pt denies new neuro symptoms such as headache, facial or extremity weakness Pt denies polydipsia, polyuria   Overall good compliance with meds, trying to follow low chol diet, wt stable, little excercise however recently and has gained approx 10 lbs,  but plans to re-start more excercise soon.  Overall good compliance with meds, and good tolerability.  Fortunately allergy symptoms have been well controlled in the past 3 months without significnat d/c itch or sneeze.  Preventive Screening-Counseling & Management      Drug Use:  no.    Problems Prior to Update: 1)  Sinusitis- Acute-nos  (ICD-461.9) 2)  Rash-nonvesicular  (ICD-782.1) 3)  Contact Dermatitis  (ICD-692.9) 4)  Sinusitis- Acute-nos  (ICD-461.9) 5)  Degenerative Joint Disease, Left Knee  (ICD-715.96) 6)  Asthmatic Bronchitis, Acute  (ICD-466.0) 7)  Preventive Health Care  (ICD-V70.0) 8)  Back Pain  (ICD-724.5) 9)  Uri  (ICD-465.9) 10)  Hypertension  (ICD-401.9) 11)  Allergic Rhinitis  (ICD-477.9) 12)  Preventive Health Care  (ICD-V70.0) 13)   Routine General Medical Exam@health  Care Fac[facility]  (ICD-V70.0)  Medications Prior to Update: 1)  Benicar 40 Mg Tabs (Olmesartan Medoxomil) .... 1/2 Once Daily 2)  Estrace 1 Mg  Tabs (Estradiol) .Marland Kitchen.. 1 By Mouth Qd 3)  Ecotrin Low Strength 81 Mg  Tbec (Aspirin) .Marland Kitchen.. 1 By Mouth Qd 4)  Daily Multiple Vitamins   Tabs (Multiple Vitamin) .Marland Kitchen.. 1 By Mouth Qd 5)  Fexofenadine Hcl 180 Mg Tabs (Fexofenadine Hcl) .Marland Kitchen.. 1po Once Daily As Needed Allergy 6)  Nasonex 50 Mcg/act Susp (Mometasone Furoate) .... 2 Puffs Each Nostril Once Daily 7)  Glucosamine 500 Mg Caps (Glucosamine Sulfate) .Marland Kitchen.. 1 By Mouth Once Daily 8)  Clarithromycin 500 Mg Tabs (Clarithromycin) .Marland Kitchen.. 1 By Mouth Two Times A Day 9)  Triamcinolone Acetonide 0.1 % Crea (Triamcinolone Acetonide) .... Use Asd Two Times A Day As Needed 10)  Prednisone 10 Mg Tabs (Prednisone) .... 3po Qd For 3days, Then 2po Qd For 3days, Then 1po Qd For 3days, Then Stop  Current Medications (verified): 1)  Benicar 40 Mg Tabs (Olmesartan Medoxomil) .... 1/2 Once Daily 2)  Estrace 1 Mg  Tabs (Estradiol) .Marland Kitchen.. 1 By Mouth Qd 3)  Ecotrin Low Strength 81 Mg  Tbec (Aspirin) .Marland Kitchen.. 1 By Mouth Qd 4)  Daily Multiple Vitamins   Tabs (Multiple Vitamin) .Marland Kitchen.. 1 By Mouth Qd 5)  Fexofenadine Hcl 180 Mg Tabs (Fexofenadine Hcl) .Marland KitchenMarland KitchenMarland Kitchen  1po Once Daily As Needed Allergy 6)  Nasonex 50 Mcg/act Susp (Mometasone Furoate) .... 2 Puffs Each Nostril Once Daily 7)  Glucosamine 500 Mg Caps (Glucosamine Sulfate) .Marland Kitchen.. 1 By Mouth Once Daily 8)  Levofloxacin 500 Mg Tabs (Levofloxacin) .Marland Kitchen.. 1 By Mouth Once Daily  Allergies (verified): 1)  ! Sulfa 2)  ! 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   Eyes:  vision grossly intact, pupils equal, and pupils round.   Ears:  bilat tm's mild red, sinus tender bilat Nose:  nasal dischargemucosal pallor and mucosal edema.   Mouth:  pharyngeal erythema and fair dentition.   Neck:  supple and cervical lymphadenopathy.   Lungs:  normal respiratory effort, normal breath sounds, and no wheezes.   Heart:  normal rate and regular rhythm.   Extremities:  no edema, no erythema    Impression & Recommendations:  Problem # 1:  SINUSITIS- ACUTE-NOS (ICD-461.9)  Her updated medication list for this problem includes:    Nasonex 50 Mcg/act Susp (Mometasone furoate) .Marland Kitchen... 2 puffs each nostril once daily    Levofloxacin 500 Mg Tabs (Levofloxacin) .Marland Kitchen... 1 by mouth once daily treat as above, f/u any worsening signs or symptoms   Problem # 2:  HYPERTENSION (ICD-401.9)  Her updated medication list for this problem includes:    Benicar 40 Mg Tabs (Olmesartan medoxomil) .Marland Kitchen... 1/2 once daily  BP today: 102/72 Prior BP: 118/78 (12/17/2009)  Prior 10 Yr Risk Heart Disease: Not enough information (07/02/2008)  Labs Reviewed: K+: 4.2 (02/25/2009) Creat: : 0.8 (02/25/2009)   Chol: 175 (02/25/2009)   HDL: 62.50 (02/25/2009)   LDL: 105 (02/25/2009)   TG: 40.0 (02/25/2009) somewhat on the low side today, asympt, pt advised to follow BP more closely at home and next visit, and report any symptoms such as dizziness  or fatigue; also gave discount card for pt to use for money off at drug store  Problem # 3:  ALLERGIC RHINITIS (ICD-477.9)  Her updated medication list for this problem includes:     Fexofenadine Hcl 180 Mg Tabs (Fexofenadine hcl) .Marland Kitchen... 1po once daily as needed allergy    Nasonex 50 Mcg/act Susp (Mometasone furoate) .Marland Kitchen... 2 puffs each nostril once daily stable overall by hx and exam, ok to continue meds/tx as is   Discussed use of allergy medications and environmental measures.   Complete Medication List: 1)  Benicar 40 Mg Tabs (Olmesartan medoxomil) .... 1/2 once daily 2)  Estrace 1 Mg Tabs (Estradiol) .Marland Kitchen.. 1 by mouth qd 3)  Ecotrin Low Strength 81 Mg Tbec (Aspirin) .Marland Kitchen.. 1 by mouth qd 4)  Daily Multiple Vitamins Tabs (Multiple vitamin) .Marland Kitchen.. 1 by mouth qd 5)  Fexofenadine Hcl 180 Mg Tabs (Fexofenadine hcl) .Marland Kitchen.. 1po once daily as needed allergy 6)  Nasonex 50 Mcg/act Susp (Mometasone furoate) .... 2 puffs each nostril once daily 7)  Glucosamine 500  Mg Caps (Glucosamine sulfate) .Marland Kitchen.. 1 by mouth once daily 8)  Levofloxacin 500 Mg Tabs (Levofloxacin) .Marland Kitchen.. 1 by mouth once daily  Patient Instructions: 1)  Please take all new medications as prescribed 2)  Continue all previous medications as before this visit  3)  Please schedule a follow-up appointment as needed. Prescriptions: LEVOFLOXACIN 500 MG TABS (LEVOFLOXACIN) 1 by mouth once daily  #10 x 0   Entered and Authorized by:   Corwin Levins MD   Signed by:   Corwin Levins MD on 02/21/2010   Method used:   Electronically to        [facility]  [address] # [id number]* (retail)       [address]       [place], [place]  [address]       Ph: [phone] or [phone]       Fax: [phone]   RxID:   [id number]    Orders Added: 1)  Est. Patient Level IV [63875]

## 2010-04-10 NOTE — Assessment & Plan Note (Signed)
Summary: steroid shot/11am/ok dahlia/cd  Nurse Visit   Allergies: 1)  ! Sulfa 2)  ! Septra  Medication Administration  Injection # 1:    Medication: Depo- Medrol 80mg     Diagnosis: CONTACT DERMATITIS (ICD-692.9)    Route: IM    Site: LUOQ gluteus    Exp Date: 06/07/2012    Lot #: [id number]    Mfr: Pharmacia    Given by: Margaret Pyle, CMA (December 19, 2009 11:06 AM)  Injection # 2:    Medication: Depo- Medrol 40mg     Diagnosis: CONTACT DERMATITIS (ICD-692.9)    Route: IM    Site: LUOQ gluteus    Exp Date: 06/07/2012    Lot #: [id number]    Mfr: Pharmacia    Given by: Margaret Pyle, CMA (December 19, 2009 11:06 AM)  Orders Added: 1)  Admin of Therapeutic Inj  intramuscular or subcutaneous [96372] 2)  Depo- Medrol 40mg  [J1030] 3)  Depo- Medrol 80mg  [J1040]

## 2010-04-10 NOTE — Progress Notes (Signed)
Summary: Rash  Phone Note Call from Patient Call back at Work Phone [phone]   Caller: Patient [phone] Summary of Call: Pt called stating that poison ivy rash is worse, now covering her arm, trunk and face. Pt says she has been using cream and benedryl as advised by MD but is is not helping and itching with rash is "intense". Please advise, pt request to come in for Depo shot. Initial call taken by: Margaret Pyle, CMA,  December 19, 2009 8:52 AM  Follow-up for Phone Call        ok for the shot;  also predpack - done per emr Follow-up by: Corwin Levins MD,  December 19, 2009 10:17 AM  Additional Follow-up for Phone Call Additional follow up Details #1::        Pt advised of Pred pack and transferred to sch nurse visit for injection Additional Follow-up by: Margaret Pyle, CMA,  December 19, 2009 10:23 AM    New/Updated Medications: PREDNISONE 10 MG TABS (PREDNISONE) 3po qd for 3days, then 2po qd for 3days, then 1po qd for 3days, then stop Prescriptions: PREDNISONE 10 MG TABS (PREDNISONE) 3po qd for 3days, then 2po qd for 3days, then 1po qd for 3days, then stop  #18 x 0   Entered and Authorized by:   Corwin Levins MD   Signed by:   Corwin Levins MD on 12/19/2009   Method used:   Electronically to        [facility]  [address] # [id number]* (retail)       [address]       [place], [place]  [address]       Ph: [phone] or [phone]       Fax: [phone]   RxID:   [id number]

## 2010-04-10 NOTE — Assessment & Plan Note (Signed)
Summary: HEAD CONGESTION  STC   Vital Signs:  Patient profile:   50 year old female Height:      65 inches Weight:      156.25 pounds BMI:     26.10 O2 Sat:      97 % on Room air Temp:     97.3 degrees F oral Pulse rate:   79 / minute BP sitting:   118 / 60  (left arm) Cuff size:   regular  Vitals Entered ByZella Ball Ewing (May 27, 2009 1:48 PM)  O2 Flow:  Room air CC: Head congestion, sore throat/RE   Primary Care Provider:  Corwin Levins MD  CC:  Head congestion and sore throat/RE.  History of Present Illness: here with 2 to 3 days acute onset moderate St with fever, and slight cough and headache , fatigue and malaise but not enough to stay home from work;  Pt denies CP, sob, doe, wheezing, orthopnea, pnd, worsening LE edema, palps, dizziness or syncope   Has colonoscopy sched for later this wk.  Pt denies new neuro symptoms such as headache, facial or extremity weakness  Good complaicne with allergy meds and no significant nasal allergy symtpoms , post nasal gtt or pain.  Problems Prior to Update: 1)  Degenerative Joint Disease, Left Knee  (ICD-715.96) 2)  Asthmatic Bronchitis, Acute  (ICD-466.0) 3)  Preventive Health Care  (ICD-V70.0) 4)  Back Pain  (ICD-724.5) 5)  Uri  (ICD-465.9) 6)  Hypertension  (ICD-401.9) 7)  Allergic Rhinitis  (ICD-477.9) 8)  Preventive Health Care  (ICD-V70.0) 9)  Routine General Medical Exam@health  Care Facl  (ICD-V70.0)  Medications Prior to Update: 1)  Benicar 40 Mg Tabs (Olmesartan Medoxomil) .... 1/2 Once Daily 2)  Estrace 1 Mg  Tabs (Estradiol) .Marland Kitchen.. 1 By Mouth Qd 3)  Ecotrin Low Strength 81 Mg  Tbec (Aspirin) .Marland Kitchen.. 1 By Mouth Qd 4)  Daily Multiple Vitamins   Tabs (Multiple Vitamin) .Marland Kitchen.. 1 By Mouth Qd 5)  Fexofenadine Hcl 180 Mg Tabs (Fexofenadine Hcl) .Marland Kitchen.. 1po Once Daily As Needed Allergy 6)  Nasonex 50 Mcg/act Susp (Mometasone Furoate) .... 2 Puffs Each Nostril Once Daily 7)  Glucosamine 500 Mg Caps (Glucosamine Sulfate) .Marland Kitchen.. 1 By Mouth  Once Daily 8)  Moviprep 100 Gm  Solr (Peg-Kcl-Nacl-Nasulf-Na Asc-C) .... As Per Prep Instructions.  Current Medications (verified): 1)  Benicar 40 Mg Tabs (Olmesartan Medoxomil) .... 1/2 Once Daily 2)  Estrace 1 Mg  Tabs (Estradiol) .Marland Kitchen.. 1 By Mouth Qd 3)  Ecotrin Low Strength 81 Mg  Tbec (Aspirin) .Marland Kitchen.. 1 By Mouth Qd 4)  Daily Multiple Vitamins   Tabs (Multiple Vitamin) .Marland Kitchen.. 1 By Mouth Qd 5)  Fexofenadine Hcl 180 Mg Tabs (Fexofenadine Hcl) .Marland Kitchen.. 1po Once Daily As Needed Allergy 6)  Nasonex 50 Mcg/act Susp (Mometasone Furoate) .... 2 Puffs Each Nostril Once Daily 7)  Glucosamine 500 Mg Caps (Glucosamine Sulfate) .Marland Kitchen.. 1 By Mouth Once Daily 8)  Moviprep 100 Gm  Solr (Peg-Kcl-Nacl-Nasulf-Na Asc-C) .... As Per Prep Instructions. 9)  Azithromycin 250 Mg Tabs (Azithromycin) .... 2po Qd For 1 Day, Then 1po Qd For 4days, Then Stop  Allergies (verified): 1)  ! Sulfa 2)  ! 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  , mild ill  Head:  normocephalic and atraumatic.   Eyes:  vision grossly intact, pupils equal, and pupils round.   Ears:  bilat tm's mild red, sinus nontender Nose:  nasal dischargemucosal pallor and mucosal edema.   Mouth:  pharyngeal erythema and fair dentition.   Neck:  supple and cervical lymphadenopathy with mild tender Lungs:  normal respiratory effort and normal breath sounds.   Heart:  normal rate and regular rhythm.   Extremities:  no edema, no erythema  Skin:  color normal and no rashes.   Psych:  not  anxious appearing and not depressed appearing.     Impression & Recommendations:  Problem # 1:  PHARYNGITIS-ACUTE (ICD-462)  Her updated medication list for this problem includes:    Ecotrin Low Strength 81 Mg Tbec (Aspirin) .Marland Kitchen... 1 by mouth qd    Azithromycin 250 Mg Tabs (Azithromycin) .Marland Kitchen... 2po qd for 1 day, then 1po qd for 4days, then stop treat as above, f/u any worsening signs or symptoms   Problem # 2:  HYPERTENSION (ICD-401.9)  Her updated medication list for this problem includes:    Benicar 40 Mg Tabs (Olmesartan medoxomil) .Marland Kitchen... 1/2 once daily stable overall by hx and exam, ok to continue meds/tx as is   BP today: 118/60 Prior BP: 122/74 (03/11/2009)  Prior 10 Yr Risk Heart Disease: Not enough information (07/02/2008)  Labs Reviewed: K+: 4.2 (02/25/2009) Creat: : 0.8 (02/25/2009)   Chol: 175 (02/25/2009)   HDL: 62.50 (02/25/2009)   LDL: 105 (02/25/2009)   TG: 40.0 (02/25/2009)  Problem # 3:  ALLERGIC RHINITIS (ICD-477.9)  Her updated medication list for this problem includes:    Fexofenadine Hcl 180 Mg Tabs (Fexofenadine hcl) .Marland Kitchen... 1po once daily as needed allergy    Nasonex 50 Mcg/act Susp (Mometasone furoate) .Marland Kitchen... 2 puffs each nostril once daily stable overall by hx and exam, ok to continue meds/tx as is   Complete Medication List: 1)  Benicar 40 Mg Tabs (Olmesartan medoxomil) .... 1/2 once daily 2)  Estrace 1 Mg Tabs (Estradiol) .Marland Kitchen.. 1 by mouth qd 3)  Ecotrin Low Strength 81 Mg Tbec (Aspirin) .Marland Kitchen.. 1 by mouth qd 4)  Daily Multiple Vitamins Tabs (Multiple vitamin) .Marland Kitchen.. 1 by mouth qd 5)  Fexofenadine Hcl 180 Mg Tabs (Fexofenadine hcl) .Marland Kitchen.. 1po once daily as needed allergy 6)  Nasonex 50 Mcg/act Susp (Mometasone furoate) .... 2 puffs each nostril once daily 7)  Glucosamine 500 Mg Caps (Glucosamine sulfate) .Marland Kitchen.. 1 by mouth once daily 8)  Moviprep 100 Gm Solr (Peg-kcl-nacl-nasulf-na asc-c) .... As per prep instructions. 9)  Azithromycin 250 Mg Tabs (Azithromycin)  .... 2po qd for 1 day, then 1po qd for 4days, then stop  Patient Instructions: 1)  Please take all new medications as prescribed  - the antibiotic - sent to the pharmacy 2)  Continue all previous medications as before this visit  3)  You can also use Mucinex OTC or it's generic for congestion  4)  Please schedule a follow-up appointment as needed. Prescriptions: AZITHROMYCIN 250 MG TABS (AZITHROMYCIN) 2po qd for 1 day, then 1po qd for 4days, then stop  #6 x 1   Entered and Authorized by:   Corwin Levins MD   Signed by:   Corwin Levins MD on 05/27/2009   Method used:   Electronically  to        [facility] # [id number]* (retail)       [address]       [place], [place]  [address]       Ph: [phone] or [phone]       Fax: [phone]   RxID:   [id number]

## 2010-04-10 NOTE — Letter (Signed)
Summary: [facility] Acute Care Instructions  [facility]  [address] [place], [place] [address]   Phone: [phone]  Fax: [phone]       Alyssa Shaw    Aug 02, 1960    MRN: [id number]        Procedure Day /Date:  05/31/09  Friday     Arrival Time:  8:30am      Procedure Time:  9:30am     Location of Procedure:                    _x _  [facility] (4th Floor)                        PREPARATION FOR COLONOSCOPY WITH MOVIPREP   Starting 5 days prior to your procedure _ 3/20/11_ do not eat nuts, seeds, popcorn, corn, beans, peas,  salads, or any raw vegetables.  Do not take any fiber supplements (e.g. Metamucil, Citrucel, and Benefiber).  THE DAY BEFORE YOUR PROCEDURE         DATE: b 05/30/09  DAY:   Thursday  1.  Drink clear liquids the entire day-NO SOLID FOOD  2.  Do not drink anything colored red or purple.  Avoid juices with pulp.  No orange juice.  3.  Drink at least 64 oz. (8 glasses) of fluid/clear liquids during the day to prevent dehydration and help the prep work efficiently.  CLEAR LIQUIDS INCLUDE: Water Jello Ice Popsicles Tea (sugar ok, no milk/cream) Powdered fruit flavored drinks Coffee (sugar ok, no milk/cream) Gatorade Juice: apple, white grape, white cranberry  Lemonade Clear bullion, consomm, broth Carbonated beverages (any kind) Strained chicken noodle soup Hard Candy                             4.  In the morning, mix first dose of MoviPrep solution:    Empty 1 Pouch A and 1 Pouch B into the disposable container    Add lukewarm drinking water to the top line of the container. Mix to dissolve    Refrigerate (mixed solution should be used within 24 hrs)  5.  Begin drinking the prep at 5:00 p.m. The MoviPrep container is divided by 4 marks.   Every 15 minutes drink the solution down to the next mark (approximately 8 oz) until the full liter is complete.   6.  Follow completed prep with 16 oz of clear liquid of your choice  (Nothing red or purple).  Continue to drink clear liquids until bedtime.  7.  Before going to bed, mix second dose of MoviPrep solution:    Empty 1 Pouch A and 1 Pouch B into the disposable container    Add lukewarm drinking water to the top line of the container. Mix to dissolve    Refrigerate  THE DAY OF YOUR PROCEDURE      DATE:   05/31/09 DAY:   Friday  Beginning at 4:30 a.m. (5 hours before procedure):         1. Every 15 minutes, drink the solution down to the next mark (approx 8 oz) until the full liter is complete.  2. Follow completed prep with 16 oz. of clear liquid of your choice.    3. You may drink clear liquids until  7:30am  (2 HOURS BEFORE PROCEDURE).   MEDICATION INSTRUCTIONS  Unless otherwise instructed, you should take regular prescription medications with a small  sip of water   as early as possible the morning of your procedure.        OTHER INSTRUCTIONS  You will need a responsible adult at least 52 years of age to accompany you and drive you home.   This person must remain in the waiting room during your procedure.  Wear loose fitting clothing that is easily removed.  Leave jewelry and other valuables at home.  However, you may wish to bring a book to read or  an iPod/MP3 player to listen to music as you wait for your procedure to start.  Remove all body piercing jewelry and leave at home.  Total time from sign-in until discharge is approximately 2-3 hours.  You should go home directly after your procedure and rest.  You can resume normal activities the  day after your procedure.  The day of your procedure you should not:   Drive   Make legal decisions   Operate machinery   Drink alcohol   Return to work  You will receive specific instructions about eating, activities and medications before you leave.    The above instructions have been reviewed and explained to me by   Ezra Sites RN  May 17, 2009 9:11 AM    I fully understand  and can verbalize these instructions _____________________________ Date _________

## 2010-04-10 NOTE — Letter (Signed)
Summary: Previsit letter  [facility]  [address] [place], [place] [address]   Phone: [phone]  Fax: [phone]       04/17/2009 MRN: [id number]  Alyssa Shaw [address] [place], [place]  [address]  Dear Ms. Alyssa Shaw,  Welcome to the Gastroenterology Division at [facility].    You are scheduled to see a nurse for your pre-procedure visit on 05-17-09 at 9:00a.m. on the 3rd floor at [facility], [address]  We ask that you try to arrive at our office 15 minutes prior to your appointment time to allow for check-in.  Your nurse visit will consist of discussing your medical and surgical history, your immediate family medical history, and your medications.    Please bring a complete list of all your medications or, if you prefer, bring the medication bottles and we will list them.  We will need to be aware of both prescribed and over the counter drugs.  We will need to know exact dosage information as well.  If you are on blood thinners (Coumadin, Plavix, Aggrenox, Ticlid, etc.) please call our office today/prior to your appointment, as we need to consult with your physician about holding your medication.   Please be prepared to read and sign documents such as consent forms, a financial agreement, and acknowledgement forms.  If necessary, and with your consent, a friend or relative is welcome to sit-in on the nurse visit with you.  Please bring your insurance card so that we may make a copy of it.  If your insurance requires a referral to see a specialist, please bring your referral form from your primary care physician.  No co-pay is required for this nurse visit.     If you cannot keep your appointment, please call [phone] to cancel or reschedule prior to your appointment date.  This allows [place] the opportunity to schedule an appointment for another patient in need of care.    Thank you for choosing [facility] for your medical  needs.  We appreciate the opportunity to care for you.  Please visit [place] at our website  to learn more about our practice.                     Sincerely.                                                                                                                   The Gastroenterology Division

## 2010-04-10 NOTE — Assessment & Plan Note (Signed)
Summary: cpx-lb   Vital Signs:  Patient profile:   50 year old female Height:      64 inches Weight:      154.25 pounds BMI:     26.57 O2 Sat:      96 % on Room air Temp:     97.9 degrees F oral Pulse rate:   76 / minute BP sitting:   120 / 70  (left arm) Cuff size:   regular  Vitals Entered By: Zella Ball Ewing CMA Duncan Dull) (March 17, 2010 3:41 PM)  O2 Flow:  Room air  CC: Adult Physical/RE   Primary Care Provider:  Corwin Levins MD  CC:  Adult Physical/RE.  History of Present Illness: here for wellness, overall doing well;  Pt denies CP, worsening sob, doe, wheezing, orthopnea, pnd, worsening LE edema, palps, dizziness or syncope  Pt denies new neuro symptoms such as headache, facial or extremity weakness  Pt denies polydipsia, polyuria  Overall good compliance with meds, trying to follow low chol diet, wt stable, little excercise however .  No fever, wt loss, night sweats, loss of appetite or other constitutional symptoms  Denies worsening depressive symptoms, suicidal ideation, or panic.   Overall good compliance with meds, and good tolerability.  Pt states good ability with ADL's, low fall risk, home safety reviewed and adequate, no significant change in hearing or vision, trying to follow lower chol diet, has been usually very active with excercise, but for some reason just hasnt had the motivation in the past 2 mo to be as active .  Still some post infection fatigue but overall improved - no further URI symtpoms  Problems Prior to Update: 1)  Sinusitis- Acute-nos  (ICD-461.9) 2)  Rash-nonvesicular  (ICD-782.1) 3)  Contact Dermatitis  (ICD-692.9) 4)  Sinusitis- Acute-nos  (ICD-461.9) 5)  Degenerative Joint Disease, Left Knee  (ICD-715.96) 6)  Asthmatic Bronchitis, Acute  (ICD-466.0) 7)  Preventive Health Care  (ICD-V70.0) 8)  Back Pain  (ICD-724.5) 9)  Uri  (ICD-465.9) 10)  Hypertension  (ICD-401.9) 11)  Allergic Rhinitis  (ICD-477.9) 12)  Preventive Health Care   (ICD-V70.0) 13)  Routine General Medical Exam@health  Care Facl  (ICD-V70.0)  Medications Prior to Update: 1)  Benicar 40 Mg Tabs (Olmesartan Medoxomil) .... 1/2 Once Daily 2)  Estrace 1 Mg  Tabs (Estradiol) .Marland Kitchen.. 1 By Mouth Qd 3)  Ecotrin Low Strength 81 Mg  Tbec (Aspirin) .Marland Kitchen.. 1 By Mouth Qd 4)  Daily Multiple Vitamins   Tabs (Multiple Vitamin) .Marland Kitchen.. 1 By Mouth Qd 5)  Fexofenadine Hcl 180 Mg Tabs (Fexofenadine Hcl) .Marland Kitchen.. 1po Once Daily As Needed Allergy 6)  Nasonex 50 Mcg/act Susp (Mometasone Furoate) .... 2 Puffs Each Nostril Once Daily 7)  Glucosamine 500 Mg Caps (Glucosamine Sulfate) .Marland Kitchen.. 1 By Mouth Once Daily 8)  Levofloxacin 500 Mg Tabs (Levofloxacin) .Marland Kitchen.. 1 By Mouth Once Daily 9)  Hydrocodone-Homatropine 5-1.5 Mg/16ml Syrp (Hydrocodone-Homatropine) .Marland Kitchen.. 1 Tsp By Mouth Q 6 Hrs As Needed Cough  Current Medications (verified): 1)  Benicar 40 Mg Tabs (Olmesartan Medoxomil) .Marland Kitchen.. 1 Once Daily 2)  Estrace 1 Mg  Tabs (Estradiol) .Marland Kitchen.. 1 By Mouth Qd 3)  Ecotrin Low Strength 81 Mg  Tbec (Aspirin) .Marland Kitchen.. 1 By Mouth Qd 4)  Daily Multiple Vitamins   Tabs (Multiple Vitamin) .Marland Kitchen.. 1 By Mouth Qd 5)  Fexofenadine Hcl 180 Mg Tabs (Fexofenadine Hcl) .Marland Kitchen.. 1po Once Daily As Needed Allergy 6)  Nasonex 50 Mcg/act Susp (Mometasone Furoate) .... 2 Puffs Each Nostril Once  Daily 7)  Glucosamine 500 Mg Caps (Glucosamine Sulfate) .Marland Kitchen.. 1 By Mouth Once Daily  Allergies (verified): 1)  ! Sulfa 2)  ! 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  Risk Factors: Smoking Status: never (03/16/2007)  Past Medical History: Allergic rhinitis migraine menopausal since  50yo Hypertension Hyperlipidemia  Review of Systems  The patient denies anorexia, fever, vision loss, decreased hearing, hoarseness, chest pain, syncope, dyspnea on exertion, peripheral edema, prolonged cough, headaches, hemoptysis, abdominal pain, melena, hematochezia, severe indigestion/heartburn, hematuria, muscle weakness, suspicious skin lesions, transient blindness, difficulty walking, depression, unusual weight change, abnormal bleeding, enlarged lymph nodes, and angioedema.         all otherwise negative per pt -    Physical Exam  General:  alert and well-developed Head:  normocephalic and atraumatic.   Eyes:  vision grossly intact, pupils equal, and pupils round.   Ears:  R ear normal and L ear normal.   Nose:  no external deformity and no nasal discharge.   Mouth:  no gingival abnormalities and pharynx pink and moist.   Neck:  supple and no masses.   Lungs:  normal respiratory effort and normal breath sounds.   Heart:  normal rate and regular rhythm.   Abdomen:  soft, non-tender, and normal bowel sounds.   Msk:  no joint tenderness and no joint swelling.  , has some finger mild bony DJD changes Extremities:  no edema, no erythema  Neurologic:  cranial nerves II-XII intact and strength normal in all extremities.   Skin:  color normal and no rashes.   Psych:  not anxious appearing and not depressed appearing.     Impression & Recommendations:  Problem # 1:  Preventive Health Care (ICD-V70.0) Overall doing well, age appropriate education and counseling updated, referral for preventive services and immunizations addressed, dietary counseling and smoking status adressed , most recent labs reviewed, ecg reviewed I have personally reviewed and have noted 1.The patient's medical and social history 2.Their use of alcohol, tobacco or illicit drugs 3.Their current medications and supplements 4. Functional ability including ADL's, fall risk, home safety risk, hearing & visual  impairment  5.Diet and physical activities 6.Evidence for depression or mood disorders The patients weight, height, BMI  have been recorded in the chart I have made referrals, counseling and provided education to the patient based review of the above  Orders: EKG w/ Interpretation (93000)  Problem # 2:  HYPERLIPIDEMIA (ICD-272.4)  Labs Reviewed: SGOT: 23 (03/12/2010)   SGPT: 25 (03/12/2010)  Prior 10 Yr Risk Heart Disease: Not enough information (07/02/2008)   HDL:43.40 (03/12/2010), 62.50 (02/25/2009)  LDL:121 (03/12/2010), 105 (02/25/2009)  Chol:171 (03/12/2010), 175 (02/25/2009)  Trig:34.0 (03/12/2010), 40.0 (02/25/2009) declines statatin, Pt to continue diet efforts,- goal LDL less than 100  Problem # 3:  HYPERTENSION (ICD-401.9)  Her updated medication list for this problem includes:    Benicar 40 Mg Tabs (Olmesartan medoxomil) .Marland Kitchen... 1 once daily  BP today: 120/70 Prior BP: 102/72 (02/21/2010)  Prior 10 Yr Risk Heart Disease: Not enough information (07/02/2008)  Labs Reviewed: K+: 4.2 (03/12/2010) Creat: : 0.7 (03/12/2010)   Chol: 171 (03/12/2010)   HDL: 43.40 (03/12/2010)   LDL: 121 (03/12/2010)   TG: 34.0 (  03/12/2010) stable overall by hx and exam, ok to continue meds/tx as is   Complete Medication List: 1)  Benicar 40 Mg Tabs (Olmesartan medoxomil) .Marland Kitchen.. 1 once daily 2)  Estrace 1 Mg Tabs (Estradiol) .Marland Kitchen.. 1 by mouth qd 3)  Ecotrin Low Strength 81 Mg Tbec (Aspirin) .Marland Kitchen.. 1 by mouth qd 4)  Daily Multiple Vitamins Tabs (Multiple vitamin) .Marland Kitchen.. 1 by mouth qd 5)  Fexofenadine Hcl 180 Mg Tabs (Fexofenadine hcl) .Marland Kitchen.. 1po once daily as needed allergy 6)  Nasonex 50 Mcg/act Susp (Mometasone furoate) .... 2 puffs each nostril once daily 7)  Glucosamine 500 Mg Caps (Glucosamine sulfate) .Marland Kitchen.. 1 by mouth once daily  Patient Instructions: 1)  Continue all previous medications as before this visit 2)  Please folllow lower cholesterol diet 3)  Please schedule a follow-up appointment  in 1 year, or sooner if needed Prescriptions: BENICAR 40 MG TABS (OLMESARTAN MEDOXOMIL) 1 once daily  #90 x 3   Entered and Authorized by:   Corwin Levins MD   Signed by:   Corwin Levins MD on 03/17/2010   Method used:   Print then Give to Patient   RxID:   [id number]    Orders Added: 1)  EKG w/ Interpretation [93000] 2)  Est. Patient 40-64 years [[address]347]

## 2010-04-10 NOTE — Progress Notes (Signed)
Summary: Rash ?  Phone Note Call from Patient Call back at [phone]   Caller: Patient Summary of Call: pt left vm stating she has finished meds prescribed for poison.  She states she is still breaking out with small white blisters under skin.  Please advise as to what she should do now? Initial call taken by: Lanier Prude, [facility]),  December 27, 2009 11:00 AM  Follow-up for Phone Call        I think best to prob see dermatology if it seems signfiicant to her -   does she want me to refer or watch over the weekend? Follow-up by: Corwin Levins MD,  December 27, 2009 11:12 AM  Additional Follow-up for Phone Call Additional follow up Details #1::        pt informed.  She is requesting referral to dermatologist. Additional Follow-up by: Lanier Prude, [facility]),  December 27, 2009 1:18 PM  New Problems: RASH-NONVESICULAR (ICD-782.1)   Additional Follow-up for Phone Call Additional follow up Details #2::    ok - done per emr Follow-up by: Corwin Levins MD,  December 27, 2009 1:19 PM  New Problems: RASH-NONVESICULAR (ICD-782.1)     Complete Medication List: 1)  Benicar 40 Mg Tabs (Olmesartan medoxomil) .... 1/2 once daily 2)  Estrace 1 Mg Tabs (Estradiol) .Marland Kitchen.. 1 by mouth qd 3)  Ecotrin Low Strength 81 Mg Tbec (Aspirin) .Marland Kitchen.. 1 by mouth qd 4)  Daily Multiple Vitamins Tabs (Multiple vitamin) .Marland Kitchen.. 1 by mouth qd 5)  Fexofenadine Hcl 180 Mg Tabs (Fexofenadine hcl) .Marland Kitchen.. 1po once daily as needed allergy 6)  Nasonex 50 Mcg/act Susp (Mometasone furoate) .... 2 puffs each nostril once daily 7)  Glucosamine 500 Mg Caps (Glucosamine sulfate) .Marland Kitchen.. 1 by mouth once daily 8)  Clarithromycin 500 Mg Tabs (Clarithromycin) .Marland Kitchen.. 1 by mouth two times a day 9)  Triamcinolone Acetonide 0.1 % Crea (Triamcinolone acetonide) .... Use asd two times a day as needed 10)  Prednisone 10 Mg Tabs (Prednisone) .... 3po qd for 3days, then 2po qd for 3days, then 1po qd for 3days, then stop  Other  Orders: Dermatology Referral (Derma)

## 2010-04-10 NOTE — Progress Notes (Signed)
Summary: Cough  Phone Note Call from Patient Call back at Work Phone [phone]   Caller: Patient [phone] Summary of Call: Pt called stating that cough is worse and is now causing HA. Pt is requesting Rx for cough syrup to [facility] [address] Initial call taken by: Margaret Pyle, CMA,  February 25, 2010 10:50 AM  Follow-up for Phone Call        ok  - done hardcopy to LIM side B - dahlia  Follow-up by: Corwin Levins MD,  February 25, 2010 1:18 PM  Additional Follow-up for Phone Call Additional follow up Details #1::        Rx faxed, pt advised. Additional Follow-up by: Margaret Pyle, CMA,  February 25, 2010 1:50 PM    New/Updated Medications: HYDROCODONE-HOMATROPINE 5-1.5 MG/5ML SYRP (HYDROCODONE-HOMATROPINE) 1 tsp by mouth q 6 hrs as needed cough Prescriptions: HYDROCODONE-HOMATROPINE 5-1.5 MG/5ML SYRP (HYDROCODONE-HOMATROPINE) 1 tsp by mouth q 6 hrs as needed cough  #6oz x 1   Entered and Authorized by:   Corwin Levins MD   Signed by:   Corwin Levins MD on 02/25/2010   Method used:   Print then Give to Patient   RxID:   [id number]

## 2010-05-01 ENCOUNTER — Other Ambulatory Visit: Payer: Self-pay | Admitting: Obstetrics and Gynecology

## 2010-05-01 DIAGNOSIS — Z1231 Encounter for screening mammogram for malignant neoplasm of breast: Secondary | ICD-10-CM

## 2010-05-06 ENCOUNTER — Ambulatory Visit
Admission: RE | Admit: 2010-05-06 | Discharge: 2010-05-06 | Disposition: A | Discharge status: Home / Self Care | Payer: 59 | Source: Ambulatory Visit | Attending: Obstetrics and Gynecology | Admitting: Obstetrics and Gynecology

## 2010-05-06 DIAGNOSIS — Z1231 Encounter for screening mammogram for malignant neoplasm of breast: Secondary | ICD-10-CM

## 2010-05-20 ENCOUNTER — Ambulatory Visit (INDEPENDENT_AMBULATORY_CARE_PROVIDER_SITE_OTHER): Payer: 59 | Admitting: Internal Medicine

## 2010-05-20 ENCOUNTER — Encounter: Payer: Self-pay | Admitting: Internal Medicine

## 2010-05-20 DIAGNOSIS — J019 Acute sinusitis, unspecified: Secondary | ICD-10-CM

## 2010-05-20 DIAGNOSIS — I10 Essential (primary) hypertension: Secondary | ICD-10-CM

## 2010-05-27 NOTE — Assessment & Plan Note (Signed)
Summary: STREP THROAT? EAR ACHE /NO FEVER /NWS   Vital Signs:  Patient profile:   50 year old female Height:      64 inches Weight:      161.25 pounds BMI:     27.78 O2 Sat:      96 % on Room air Temp:     97.2 degrees F oral Pulse rate:   86 / minute BP sitting:   110 / 70  (left arm) Cuff size:   regular  Vitals Entered By: Zella Ball Ewing CMA Duncan Dull) (May 20, 2010 1:37 PM)  O2 Flow:  Room air CC: Ear and throat pain, chest wheezing/RE   Primary Care Provider:  Corwin Levins MD  CC:  Ear and throat pain and chest wheezing/RE.  History of Present Illness: here with 3 days onset midl to mod fever, facial pain, left earache, ST, and congestion, with greenish d/c,  no cough, and Pt denies CP, worsening sob, doe, wheezing, orthopnea, pnd, worsening LE edema, palps, dizziness or syncope  Pt denies new neuro symptoms such as headache, facial or extremity weakness  Pt denies polydipsia, polyuria.  No sick contacts.    Problems Prior to Update: 1)  Hyperlipidemia  (ICD-272.4) 2)  Sinusitis- Acute-nos  (ICD-461.9) 3)  Rash-nonvesicular  (ICD-782.1) 4)  Contact Dermatitis  (ICD-692.9) 5)  Sinusitis- Acute-nos  (ICD-461.9) 6)  Degenerative Joint Disease, Left Knee  (ICD-715.96) 7)  Asthmatic Bronchitis, Acute  (ICD-466.0) 8)  Preventive Health Care  (ICD-V70.0) 9)  Back Pain  (ICD-724.5) 10)  Uri  (ICD-465.9) 11)  Hypertension  (ICD-401.9) 12)  Allergic Rhinitis  (ICD-477.9) 13)  Preventive Health Care  (ICD-V70.0) 14)  Routine General Medical Exam@health  Care Facl  (ICD-V70.0)  Medications Prior to Update: 1)  Benicar 40 Mg Tabs (Olmesartan Medoxomil) .Marland Kitchen.. 1 Once Daily 2)  Estrace 1 Mg  Tabs (Estradiol) .Marland Kitchen.. 1 By Mouth Qd 3)  Ecotrin Low Strength 81 Mg  Tbec (Aspirin) .Marland Kitchen.. 1 By Mouth Qd 4)  Daily Multiple Vitamins   Tabs (Multiple Vitamin) .Marland Kitchen.. 1 By Mouth Qd 5)  Fexofenadine Hcl 180 Mg Tabs (Fexofenadine Hcl) .Marland Kitchen.. 1po Once Daily As Needed Allergy 6)  Nasonex 50 Mcg/act Susp  (Mometasone Furoate) .... 2 Puffs Each Nostril Once Daily 7)  Glucosamine 500 Mg Caps (Glucosamine Sulfate) .Marland Kitchen.. 1 By Mouth Once Daily  Current Medications (verified): 1)  Benicar 40 Mg Tabs (Olmesartan Medoxomil) .Marland Kitchen.. 1 Once Daily 2)  Estrace 1 Mg  Tabs (Estradiol) .Marland Kitchen.. 1 By Mouth Qd 3)  Ecotrin Low Strength 81 Mg  Tbec (Aspirin) .Marland Kitchen.. 1 By Mouth Qd 4)  Daily Multiple Vitamins   Tabs (Multiple Vitamin) .Marland Kitchen.. 1 By Mouth Qd 5)  Fexofenadine Hcl 180 Mg Tabs (Fexofenadine Hcl) .Marland Kitchen.. 1po Once Daily As Needed Allergy 6)  Nasonex 50 Mcg/act Susp (Mometasone Furoate) .... 2 Puffs Each Nostril Once Daily 7)  Glucosamine 500 Mg Caps (Glucosamine Sulfate) .Marland Kitchen.. 1 By Mouth Once Daily 8)  Azithromycin 250 Mg Tabs (Azithromycin) .... 2po Qd For 1 Day, Then 1po Qd For 4days, Then Stop  Allergies (verified): 1)  ! Sulfa 2)  ! 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   Eyes:  vision grossly intact, pupils equal, and pupils round.   Ears:  left TM severe erythema, right tm ok, sinus tender bilat Nose:  no external deformity and no nasal discharge.   Mouth:  pharyngeal erythema and fair dentition.   Neck:  supple and cervical lymphadenopathy.   Lungs:  normal respiratory effort and normal breath sounds.   Heart:  normal rate and regular rhythm.   Extremities:  no edema, no erythema    Impression & Recommendations:  Problem # 1:  SINUSITIS- ACUTE-NOS  (ICD-461.9)  Her updated medication list for this problem includes:    Nasonex 50 Mcg/act Susp (Mometasone furoate) .Marland Kitchen... 2 puffs each nostril once daily    Azithromycin 250 Mg Tabs (Azithromycin) .Marland Kitchen... 2po qd for 1 day, then 1po qd for 4days, then stop treat as above, f/u any worsening signs or symptoms   Problem # 2:  HYPERTENSION (ICD-401.9)  Her updated medication list for this problem includes:    Benicar 40 Mg Tabs (Olmesartan medoxomil) .Marland Kitchen... 1 once daily  BP today: 110/70 Prior BP: 120/70 (03/17/2010)  Prior 10 Yr Risk Heart Disease: Not enough information (07/02/2008)  Labs Reviewed: K+: 4.2 (03/12/2010) Creat: : 0.7 (03/12/2010)   Chol: 171 (03/12/2010)   HDL: 43.40 (03/12/2010)   LDL: 121 (03/12/2010)   TG: 34.0 (03/12/2010) stable overall by hx and exam, ok to continue meds/tx as is   Complete Medication List: 1)  Benicar 40 Mg Tabs (Olmesartan medoxomil) .Marland Kitchen.. 1 once daily 2)  Estrace 1 Mg Tabs (Estradiol) .Marland Kitchen.. 1 by mouth qd 3)  Ecotrin Low Strength 81 Mg Tbec (Aspirin) .Marland Kitchen.. 1 by mouth qd 4)  Daily Multiple Vitamins Tabs (Multiple vitamin) .Marland Kitchen.. 1 by mouth qd 5)  Fexofenadine Hcl 180 Mg Tabs (Fexofenadine hcl) .Marland Kitchen.. 1po once daily as needed allergy 6)  Nasonex 50 Mcg/act Susp (Mometasone furoate) .... 2 puffs each nostril once daily 7)  Glucosamine 500 Mg Caps (Glucosamine sulfate) .Marland Kitchen.. 1 by mouth once daily 8)  Azithromycin 250 Mg Tabs (Azithromycin) .... 2po qd for 1 day, then 1po qd for 4days, then stop  Patient Instructions: 1)  Please take all new medications as prescribed 2)  Continue all previous medications as before this visit  3)  Please schedule a follow-up appointment as needed. Prescriptions: AZITHROMYCIN 250 MG TABS (AZITHROMYCIN) 2po qd for 1 day, then 1po qd for 4days, then stop  #6 x 1   Entered and Authorized by:   Corwin Levins MD   Signed by:   Corwin Levins MD on 05/20/2010   Method used:   Print then Give to Patient   RxID:    [id number]    Orders Added: 1)  Est. Patient Level III [13244]

## 2010-06-23 ENCOUNTER — Ambulatory Visit (INDEPENDENT_AMBULATORY_CARE_PROVIDER_SITE_OTHER): Payer: 59 | Admitting: Internal Medicine

## 2010-06-23 ENCOUNTER — Encounter: Payer: Self-pay | Admitting: Internal Medicine

## 2010-06-23 VITALS — BP 106/68 | HR 90 | Temp 97.9°F | Resp 14 | Ht 64.0 in | Wt 150.0 lb

## 2010-06-23 DIAGNOSIS — Z0000 Encounter for general adult medical examination without abnormal findings: Secondary | ICD-10-CM | POA: Insufficient documentation

## 2010-06-23 DIAGNOSIS — I10 Essential (primary) hypertension: Secondary | ICD-10-CM

## 2010-06-23 DIAGNOSIS — L259 Unspecified contact dermatitis, unspecified cause: Secondary | ICD-10-CM | POA: Insufficient documentation

## 2010-06-23 DIAGNOSIS — J309 Allergic rhinitis, unspecified: Secondary | ICD-10-CM

## 2010-06-23 MED ORDER — PREDNISONE 10 MG PO TABS: ORAL_TABLET | ORAL | Status: DC

## 2010-06-23 MED ORDER — METHYLPREDNISOLONE ACETATE 80 MG/ML IJ SUSP
120.0000 mg | Freq: Once | INTRAMUSCULAR | Status: AC
  Administered 2010-06-23: 120 mg via INTRAMUSCULAR

## 2010-06-23 NOTE — Patient Instructions (Signed)
You had the steroid shot today Take all new medications as prescribed Continue all other medications as before Please return in Jan 2013 for your yearly visit, or sooner if needed, with Lab testing done 3-5 days before

## 2010-06-23 NOTE — Assessment & Plan Note (Signed)
stable overall by hx and exam, most recent lab reviewed with pt, and pt to continue medical treatment as before ble  BP Readings from Last 3 Encounters:  06/23/10 106/68  05/20/10 110/70  03/17/10 120/70

## 2010-06-23 NOTE — Assessment & Plan Note (Signed)
stable overall by hx and exam, has taken flonase and rx antihist in the past but states severity ok at this point and will suffice with OTC allegra prn

## 2010-06-23 NOTE — Progress Notes (Signed)
  Subjective:    Patient ID: Alyssa Shaw, female    DOB: [birth year], 50 y.o.   MRN: [id number]  HPI  Here for acute visit - with 1 wk graudally worsening prurutic rash to right lower abdomen, side and back after working in the yard, even though she tried to wear long sleeves and pants;  Now rash in the past 2 days also spreading to right arm;  Has hx of similar last yr and tx with fluocinonide cream but cream just not helping this time.  Also with ongoing nasal allergy symtpoms with clearish drainage , without fever, pain, colored d/c, ST or cough, but very mild at this time, only taking OTC antihist prn. Pt denies chest pain, increased sob or doe, wheezing, orthopnea, PND, increased LE swelling, palpitations, dizziness or syncope. Pt denies new neurological symptoms such as new headache, or facial or extremity weakness or numbness   Pt denies polydipsia, polyuria.  Overall good compliance with treatment, and good medicine tolerability. Past Medical History  Diagnosis Date  . HYPERTENSION 03/16/2007  . ALLERGIC RHINITIS 03/16/2007  . HYPERLIPIDEMIA 03/17/2010  . DEGENERATIVE JOINT DISEASE, LEFT KNEE 03/11/2009  . BACK PAIN 08/05/2007   No past surgical history on file.  reports that she has never smoked. She does not have any smokeless tobacco history on file. Her alcohol and drug histories not on file. family history is not on file. Allergies  Allergen Reactions  . Sulfamethoxazole W/Trimethoprim     REACTION: swelling  . Sulfonamide Derivatives    No current outpatient prescriptions on file prior to visit.   No current facility-administered medications on file prior to visit.   Review of Systems All otherwise neg per pt     Objective:   Physical Exam BP 106/68  Pulse 90  Temp(Src) 97.9 F (36.6 C) (Oral)  Resp 14  Ht 5\' 4"  (1.626 m)  Wt 150 lb (68.04 kg)  BMI 25.75 kg/m2  SpO2 98% Physical Exam  VS noted Constitutional: Pt appears well-developed and well-nourished.  HENT: Head:  Normocephalic.  Right Ear: External ear normal.  Left Ear: External ear normal.  Eyes: Conjunctivae and EOM are normal. Pupils are equal, round, and reactive to light.  Neck: Normal range of motion. Neck supple.  Cardiovascular: Normal rate and regular rhythm.   Pulmonary/Chest: Effort normal and breath sounds normal.  Abd:  Soft, NT, non-distended, + BS Neurological: Pt is alert. No cranial nerve deficit.  Skin: Skin is warm. No erythema. except for extensive but typical contact dermatitic rash to right lower abd, side and right lumbar area; Also several spots to right arm near the wrist as well, quite pruritic but no excoriations or signs of cellulitis Psychiatric: Pt behavior is normal. Thought content normal.         Assessment & Plan:

## 2010-06-23 NOTE — Assessment & Plan Note (Signed)
Mild to mod overall, for depomedrol IM now, and then low does short course prednisone for home; ok to cont the topical fluocininide she already has at home as well

## 2010-07-25 NOTE — Assessment & Plan Note (Signed)
[facility]                                 ON-CALL NOTE   KADAJAH, KJOS                         MRN:          [id number]  DATE:09/19/2006                            DOB:          12/05/1960    PRIMARY CARE PHYSICIAN:  Dr. Jonny Ruiz.   Ms. Montecalvo called in today stating that she has been having dizziness  and a severe cough.  She reports that she has been seen by Dr. Jonny Ruiz and  has been prescribed 2 rounds of antibiotics.  One was a 10-day course  followed by a recent Z-Pak.  Additionally, she was given Tussin for the  cough, but her cough has not improved significantly.  She reports that  when she turns her head she gets dizzy.  The patient reports at this  point she is not had any x-rays or blood work done.   PLAN:  Given that the patient's symptoms have continued and not  improved, now they are complicated with dizziness, I advised the patient  to be seen at the emergency department at [facility] where  they can do further assessment, given the length of her illness.  The  patient was advised of all this, and she will go to [facility].     Leanne Chang, M.D.  Electronically Signed    LA/MedQ  DD: 09/18/2006  DT: 09/19/2006  Job #: [id number]

## 2011-03-11 ENCOUNTER — Encounter: Payer: Self-pay | Admitting: *Deleted

## 2011-03-11 ENCOUNTER — Ambulatory Visit (INDEPENDENT_AMBULATORY_CARE_PROVIDER_SITE_OTHER): Payer: 59 | Admitting: Internal Medicine

## 2011-03-11 VITALS — BP 130/78 | HR 77 | Temp 98.1°F | Ht 64.0 in | Wt 152.0 lb

## 2011-03-11 DIAGNOSIS — J029 Acute pharyngitis, unspecified: Secondary | ICD-10-CM

## 2011-03-11 DIAGNOSIS — I10 Essential (primary) hypertension: Secondary | ICD-10-CM

## 2011-03-11 DIAGNOSIS — J309 Allergic rhinitis, unspecified: Secondary | ICD-10-CM

## 2011-03-11 MED ORDER — AZITHROMYCIN 250 MG PO TABS: ORAL_TABLET | ORAL | Status: AC

## 2011-03-11 MED ORDER — HYDROCODONE-HOMATROPINE 5-1.5 MG/5ML PO SYRP: 5.0000 mL | ORAL_SOLUTION | Freq: Four times a day (QID) | ORAL | Status: AC | PRN

## 2011-03-11 NOTE — Patient Instructions (Signed)
Take all new medications as prescribed Continue all other medications as before  

## 2011-03-15 ENCOUNTER — Encounter: Payer: Self-pay | Admitting: Internal Medicine

## 2011-03-15 NOTE — Assessment & Plan Note (Signed)
Mild to mod, for otc allegra prn, to f/u any worsening symptoms or concerns 

## 2011-03-15 NOTE — Assessment & Plan Note (Signed)
mod, for antibx course,  to f/u any worsening symptoms or concerns 

## 2011-03-15 NOTE — Assessment & Plan Note (Signed)
stable overall by hx and exam, most recent data reviewed with pt, and pt to continue medical treatment as before BP Readings from Last 3 Encounters:  03/11/11 130/78  06/23/10 106/68  05/20/10 110/70

## 2011-03-15 NOTE — Progress Notes (Signed)
  Subjective:    Patient ID: Alyssa Shaw, female    DOB: 03-21-1960, 51 y.o.   MRN: [id number]  HPI   Here with 3 days acute onset fever, severe ST, general weakness and malaise, but little to no cough and Pt denies chest pain, increased sob or doe, wheezing, orthopnea, PND, increased LE swelling, palpitations, dizziness or syncope.  Pt denies new neurological symptoms such as new headache, or facial or extremity weakness or numbness  Pt denies polydipsia, polyuria,.  Does have several wks ongoing nasal allergy symptoms with clear congestion, itch and sneeze, without fever, pain, ST, cough or wheezing, but better with otc allegra or similar.  Overall good compliance with treatment, and good medicine tolerability.  Past Medical History  Diagnosis Date  . HYPERTENSION 03/16/2007  . ALLERGIC RHINITIS 03/16/2007  . HYPERLIPIDEMIA 03/17/2010  . DEGENERATIVE JOINT DISEASE, LEFT KNEE 03/11/2009  . BACK PAIN 08/05/2007  . Migraine   . Menopausal syndrome     since 51 yo   No past surgical history on file.  reports that she has never smoked. She does not have any smokeless tobacco history on file. She reports that she drinks alcohol. She reports that she does not use illicit drugs. family history includes Diabetes in her mother; Glaucoma in her mother; Heart murmur in her mother; and Hypertension in her sisters. Allergies  Allergen Reactions  . Sulfamethoxazole W/Trimethoprim     REACTION: swelling  . Sulfonamide Derivatives    Current Outpatient Prescriptions on File Prior to Visit  Medication Sig Dispense Refill  . aspirin 81 MG EC tablet Take 81 mg by mouth daily.        Marland Kitchen estradiol (ESTRACE) 1 MG tablet Take 1 mg by mouth daily.        . Fluocinonide (VANOS) 0.1 % CREA Apply topically 2 (two) times daily as needed. Apply to affected areas       . Multiple Vitamins-Minerals (MULTIVITAMIN,TX-MINERALS) tablet Take 1 tablet by mouth daily.        Marland Kitchen olmesartan (BENICAR) 40 MG tablet Take 40 mg by mouth  daily.         Review of Systems Review of Systems  Constitutional: Negative for diaphoresis and unexpected weight change.  HENT: Negative for drooling and tinnitus.   Eyes: Negative for photophobia and visual disturbance.  Respiratory: Negative for choking and stridor.   Gastrointestinal: Negative for vomiting and blood in stool.  Genitourinary: Negative for hematuria and decreased urine volume.        Objective:   Physical Exam BP 130/78  Pulse 77  Temp(Src) 98.1 F (36.7 C) (Oral)  Ht 5\' 4"  (1.626 m)  Wt 152 lb (68.947 kg)  BMI 26.09 kg/m2  SpO2 97% Physical Exam  VS noted, mild ill Constitutional: Pt appears well-developed and well-nourished.  HENT: Head: Normocephalic.  Right Ear: External ear normal.  Left Ear: External ear normal.  Bilat tm's mild erythema.  Sinus nontender.  Pharynx severe erythema, with exudate Eyes: Conjunctivae and EOM are normal. Pupils are equal, round, and reactive to light.  Neck: Normal range of motion. Neck supple.  Cardiovascular: Normal rate and regular rhythm.   Pulmonary/Chest: Effort normal and breath sounds normal.  Neurological: Pt is alert. No cranial nerve deficit.  Skin: Skin is warm. No erythema.  Psychiatric: Pt behavior is normal. Thought content normal.     Assessment & Plan:

## 2011-03-23 ENCOUNTER — Encounter: Payer: Self-pay | Admitting: Physician Assistant

## 2011-03-23 ENCOUNTER — Telehealth: Payer: Self-pay | Admitting: Gastroenterology

## 2011-03-23 ENCOUNTER — Ambulatory Visit (INDEPENDENT_AMBULATORY_CARE_PROVIDER_SITE_OTHER): Payer: 59 | Admitting: Physician Assistant

## 2011-03-23 ENCOUNTER — Other Ambulatory Visit (INDEPENDENT_AMBULATORY_CARE_PROVIDER_SITE_OTHER): Payer: 59

## 2011-03-23 DIAGNOSIS — R1032 Left lower quadrant pain: Secondary | ICD-10-CM

## 2011-03-23 DIAGNOSIS — K625 Hemorrhage of anus and rectum: Secondary | ICD-10-CM

## 2011-03-23 LAB — BASIC METABOLIC PANEL
BUN: 11 mg/dL (ref 6–23)
Chloride: 100 mEq/L (ref 96–112)
Glucose, Bld: 107 mg/dL — ABNORMAL HIGH (ref 70–99)
Potassium: 4.8 mEq/L (ref 3.5–5.1)

## 2011-03-23 LAB — CBC WITH DIFFERENTIAL/PLATELET
Basophils Relative: 0.3 % (ref 0.0–3.0)
Eosinophils Relative: 0.9 % (ref 0.0–5.0)
Lymphocytes Relative: 21.2 % (ref 12.0–46.0)
Monocytes Relative: 6.8 % (ref 3.0–12.0)
Neutrophils Relative %: 70.8 % (ref 43.0–77.0)
RBC: 4.41 Mil/uL (ref 3.87–5.11)
WBC: 9.5 10*3/uL (ref 4.5–10.5)

## 2011-03-23 MED ORDER — GLYCOPYRROLATE 2 MG PO TABS: ORAL_TABLET | ORAL | Status: DC

## 2011-03-23 NOTE — Patient Instructions (Signed)
Please go to the basement level to have your labs drawn.  I sent a prescription to [facility] [address] for Robinul Forte. Stay our of work until Wednesday the 16th.  Push fluids and gradually advance your diet.   You have been scheduled for a CT scan of the abdomen and pelvis at [facility] ([address]---this is in the same building as Architectural technologist).   You are scheduled on 03-24-2011 at 8:30 AM . You should arrive 15 minutes prior to your appointment time for registration. Please follow the written instructions below on the day of your exam: Arrive at 8:15 Am.  WARNING: IF YOU ARE ALLERGIC TO IODINE/X-RAY DYE, PLEASE NOTIFY RADIOLOGY IMMEDIATELY AT [phone]! YOU WILL BE GIVEN A 13 HOUR PREMEDICATION PREP.  1) Do not eat or drink anything after 4;30 AM. (4 hours prior to your test) 2) You have been given 2 bottles of oral contrast to drink. The solution may taste better if refrigerated, but do NOT add ice or any other liquid to this solution. Shake well before drinking.    Drink 1 bottle of contrast @6 :30 AM(2 hours prior to your exam)  Drink 1 bottle of contrast @7 :30 AM  (1 hour prior to your exam)  You may take any medications as prescribed with a small amount of water except for the following: Metformin, Glucophage, Glucovance, Avandamet, Riomet, Fortamet, Actoplus Met, Janumet, Glumetza or Metaglip. The above medications must be held the day of the exam AND 48 hours after the exam.  The purpose of you drinking the oral contrast is to aid in the visualization of your intestinal tract. The contrast solution may cause some diarrhea. Before your exam is started, you will be given a small amount of fluid to drink. Depending on your individual set of symptoms, you may also receive an intravenous injection of x-ray contrast/dye. Plan on being at [facility] for 30 minutes or long, depending on the type of exam you are having performed.  If you have any  questions regarding your exam or if you need to reschedule, you may call the CT department at [phone] between the hours of 8:00 am and 5:00 pm, Monday-Friday.  ________________________________________________________________________

## 2011-03-23 NOTE — Telephone Encounter (Signed)
Pt had normal COLON on 05/31/09. Pt reports she developed diarrhea with cramping and passing BRB since Saturday. The pain/cramping located in LLQ is so bad, she's using a heating pad. Pt reports she called Dr Raphael Gibney office and they asked her to call us. Pt given an appt today with Mike Gip, PA.

## 2011-03-24 ENCOUNTER — Encounter: Payer: Self-pay | Admitting: Physician Assistant

## 2011-03-24 ENCOUNTER — Telehealth: Payer: Self-pay | Admitting: *Deleted

## 2011-03-24 ENCOUNTER — Ambulatory Visit (INDEPENDENT_AMBULATORY_CARE_PROVIDER_SITE_OTHER)
Admission: RE | Admit: 2011-03-24 | Discharge: 2011-03-24 | Disposition: A | Discharge status: Home / Self Care | Payer: 59 | Source: Ambulatory Visit | Attending: Physician Assistant | Admitting: Physician Assistant

## 2011-03-24 DIAGNOSIS — R1032 Left lower quadrant pain: Secondary | ICD-10-CM

## 2011-03-24 DIAGNOSIS — K625 Hemorrhage of anus and rectum: Secondary | ICD-10-CM

## 2011-03-24 MED ORDER — IOHEXOL 300 MG/ML  SOLN
100.0000 mL | Freq: Once | INTRAMUSCULAR | Status: AC | PRN
  Administered 2011-03-24: 100 mL via INTRAVENOUS

## 2011-03-24 NOTE — Telephone Encounter (Signed)
Message copied by Daphine Deutscher on Tue Mar 24, 2011  2:36 PM ------      Message from: Hugo, Virginia S      Created: Tue Mar 24, 2011 10:42 AM       Please let Janeva know her labs are normal, her CT scan shows a mild left sided colitis which would be consistent with a segmental ischemic  Colitis. She should continue with plans that we talked about yesterday-see how she is feeling.

## 2011-03-24 NOTE — Telephone Encounter (Signed)
Message copied by Daphine Deutscher on Tue Mar 24, 2011  2:34 PM ------      Message from: Brook, Virginia S      Created: Tue Mar 24, 2011 10:42 AM       Please let Criss know her labs are normal, her CT scan shows a mild left sided colitis which would be consistent with a segmental ischemic  Colitis. She should continue with plans that we talked about yesterday-see how she is feeling.

## 2011-03-24 NOTE — Progress Notes (Signed)
Agree with initial assessment and plans. Amy to follow up on CT

## 2011-03-24 NOTE — Telephone Encounter (Signed)
Message copied by Daphine Deutscher on Tue Mar 24, 2011  2:32 PM ------      Message from: [place], Virginia S      Created: Tue Mar 24, 2011 10:40 AM       Please let pt know her labs were normal

## 2011-03-24 NOTE — Progress Notes (Signed)
Subjective:    Patient ID: Alyssa Shaw, female    DOB: 01/24/1961, 51 y.o.   MRN: [id number]  HPI Alyssa Shaw is a very nice 51 year old female known to Dr. Jarold Motto who had a colonoscopy in March of 2011 which was normal. This was done for screening. At this time she comes in as an urgent add-on with complaints of onset Saturday, January 12 with sharp left-sided abdominal pain and cramping associated with diaphoresis and nausea. This was followed by several loose bowel movements which eventually turned bloody. Her symptoms eased off, and then about 3 AM she was awakened again with sharp left-sided pain and bloody stools. She says she had 2 or 3 more episodes and then went back to bed and about 9 AM had some smaller bowel movements were which were just very small amounts of blood.. She denies any fever or chills, , no dizziness or lightheadedness. Her new medications include a just completed Z-Pak and she was taking a codeine containing cough syrup as well. She denies any use of other cold  Medications, Sudafed etc. She is on Estrace once daily. At this point she is feeling a little bit better has had not had any bloody stools for the past several hours . Her pain is not as bad as it was at the time of onset. She has not tried to eat.    Review of Systems  Constitutional: Positive for diaphoresis and appetite change.  HENT: Negative.   Eyes: Negative.   Respiratory: Negative.   Cardiovascular: Negative.   Gastrointestinal: Positive for abdominal pain and blood in stool.  Genitourinary: Negative.   Musculoskeletal: Negative.   Neurological: Negative.   Hematological: Negative.   Psychiatric/Behavioral: Negative.    Outpatient Prescriptions Prior to Visit  Medication Sig Dispense Refill  . aspirin 81 MG EC tablet Take 81 mg by mouth daily.        . Calcium Carb-Cholecalciferol (CALCIUM 1000 + D PO) Take by mouth daily.        . Cyanocobalamin (VITAMIN B-12 CR PO) Take by mouth daily.        Marland Kitchen  estradiol (ESTRACE) 1 MG tablet Take 1 mg by mouth daily.        . Flaxseed, Linseed, (FLAX SEEDS PO) Take by mouth daily.        . Fluocinonide (VANOS) 0.1 % CREA Apply topically 2 (two) times daily as needed. Apply to affected areas       . Multiple Vitamins-Minerals (MULTIVITAMIN,TX-MINERALS) tablet Take 1 tablet by mouth daily.        Marland Kitchen olmesartan (BENICAR) 40 MG tablet Take 40 mg by mouth daily.        . Omega-3 Fatty Acids (FISH OIL PO) Take by mouth daily.        . vitamin C (ASCORBIC ACID) 500 MG tablet Take 500 mg by mouth daily.        . vitamin E 100 UNIT capsule Take 100 Units by mouth daily.         No facility-administered medications prior to visit.       Allergies  Allergen Reactions  . Sulfamethoxazole W/Trimethoprim     REACTION: swelling  . Sulfonamide Derivatives    Active Ambulatory Problems    Diagnosis Date Noted  . HYPERTENSION 03/16/2007  . ALLERGIC RHINITIS 03/16/2007  . DEGENERATIVE JOINT DISEASE, LEFT KNEE 03/11/2009  . BACK PAIN 08/05/2007  . HYPERLIPIDEMIA 03/17/2010  . Preventative health care 06/23/2010  . Acute pharyngitis 03/11/2011   Resolved  Ambulatory Problems    Diagnosis Date Noted  . SINUSITIS- ACUTE-NOS 10/15/2009  . URI 08/05/2007  . ASTHMATIC BRONCHITIS, ACUTE 03/21/2008  . CONTACT DERMATITIS 12/17/2009  . RASH-NONVESICULAR 12/27/2009  . Contact dermatitis 06/23/2010   Past Medical History  Diagnosis Date  . Migraine   . Menopausal syndrome   . Arthritis   . Pneumonia     Objective:   Physical Exam well-developed white female in no acute distress, alert and oriented x3, pleasant, HEENT; nontraumatic, normocephalic, EOMI, PERRLA sclera anicteric, Supple no JVD, Cardiovascular; regular rate and rhythm with S1-S2 no murmur rub or gallop, Pulmonary; clear bilaterally, Abdomen; soft, mildly tender in the left lower quadrant there is no guarding no rebound no mass or hepatosplenomegaly bowel sounds are active, Rectal; not done,  Extremities; no clubbing cyanosis or edema skin warm and dry, Psych; mood and affect normal and appropriate        Assessment & Plan:  #36 51 year old female with an acute colitis likely a segmental ischemic colitis, less likely infectious. Her symptoms are classic for ischemic colitis. This may have been precipitated by cough syrup or other OTC medication, relative dehydration, or possibly oral estrogen therapy. She is feeling a bit better at this point is able to tolerate fluids. Plan; we'll check CBC and be met today CT scan of the abdomen and pelvis . Patient to push fluids and start soft bland diet Out of work until 03/25/2011 We'll give trial of Robinul Forte 2 mg by mouth twice daily as needed for cramping She will followup with Dr. Jarold Motto, as needed, any further plans will be based on CT findings.

## 2011-03-24 NOTE — Telephone Encounter (Signed)
Spoke with patient and gave her results as per Amy Esterwood, PA. 

## 2011-03-24 NOTE — Telephone Encounter (Signed)
Spoke with patient and gave her results as per Mike Gip, PA. She states she is feeling better, less pain today.

## 2011-04-07 ENCOUNTER — Telehealth: Payer: Self-pay

## 2011-04-07 ENCOUNTER — Other Ambulatory Visit: Payer: Self-pay | Admitting: Obstetrics and Gynecology

## 2011-04-07 DIAGNOSIS — Z0000 Encounter for general adult medical examination without abnormal findings: Secondary | ICD-10-CM

## 2011-04-07 DIAGNOSIS — Z1231 Encounter for screening mammogram for malignant neoplasm of breast: Secondary | ICD-10-CM

## 2011-04-07 NOTE — Telephone Encounter (Signed)
Put order in for physical labs. 

## 2011-05-08 ENCOUNTER — Ambulatory Visit
Admission: RE | Admit: 2011-05-08 | Discharge: 2011-05-08 | Disposition: A | Discharge status: Home / Self Care | Payer: 59 | Source: Ambulatory Visit | Attending: Obstetrics and Gynecology | Admitting: Obstetrics and Gynecology

## 2011-05-08 DIAGNOSIS — Z1231 Encounter for screening mammogram for malignant neoplasm of breast: Secondary | ICD-10-CM

## 2011-05-13 ENCOUNTER — Other Ambulatory Visit: Payer: Self-pay | Admitting: Obstetrics and Gynecology

## 2011-05-13 ENCOUNTER — Other Ambulatory Visit (INDEPENDENT_AMBULATORY_CARE_PROVIDER_SITE_OTHER): Payer: 59

## 2011-05-13 DIAGNOSIS — R928 Other abnormal and inconclusive findings on diagnostic imaging of breast: Secondary | ICD-10-CM

## 2011-05-13 DIAGNOSIS — Z0000 Encounter for general adult medical examination without abnormal findings: Secondary | ICD-10-CM

## 2011-05-13 LAB — HEPATIC FUNCTION PANEL
Alkaline Phosphatase: 47 U/L (ref 39–117)
Bilirubin, Direct: 0.1 mg/dL (ref 0.0–0.3)
Total Bilirubin: 0.4 mg/dL (ref 0.3–1.2)

## 2011-05-13 LAB — CBC WITH DIFFERENTIAL/PLATELET
Basophils Absolute: 0 10*3/uL (ref 0.0–0.1)
HCT: 39.1 % (ref 36.0–46.0)
Lymphs Abs: 1.7 10*3/uL (ref 0.7–4.0)
MCV: 92.1 fl (ref 78.0–100.0)
Monocytes Absolute: 0.5 10*3/uL (ref 0.1–1.0)
Monocytes Relative: 8.1 % (ref 3.0–12.0)
Neutrophils Relative %: 61 % (ref 43.0–77.0)
Platelets: 214 10*3/uL (ref 150.0–400.0)
RDW: 13.8 % (ref 11.5–14.6)

## 2011-05-13 LAB — URINALYSIS, ROUTINE W REFLEX MICROSCOPIC
Specific Gravity, Urine: <=1.005 (ref 1.000–1.030)
Total Protein, Urine: NEGATIVE
Urine Glucose: NEGATIVE
Urobilinogen, UA: 0.2 (ref 0.0–1.0)
pH: 7 (ref 5.0–8.0)

## 2011-05-13 LAB — BASIC METABOLIC PANEL
Calcium: 9.1 mg/dL (ref 8.4–10.5)
GFR: 73.51 mL/min (ref >60.00)
Potassium: 4.3 mEq/L (ref 3.5–5.1)
Sodium: 137 mEq/L (ref 135–145)

## 2011-05-13 LAB — LIPID PANEL
Cholesterol: 203 mg/dL — ABNORMAL HIGH (ref 0–200)
Total CHOL/HDL Ratio: 3

## 2011-05-19 ENCOUNTER — Encounter: Payer: Self-pay | Admitting: Internal Medicine

## 2011-05-19 ENCOUNTER — Ambulatory Visit (INDEPENDENT_AMBULATORY_CARE_PROVIDER_SITE_OTHER): Payer: 59 | Admitting: Internal Medicine

## 2011-05-19 VITALS — BP 120/62 | HR 69 | Temp 98.2°F | Ht 64.0 in | Wt 154.2 lb

## 2011-05-19 DIAGNOSIS — Z0000 Encounter for general adult medical examination without abnormal findings: Secondary | ICD-10-CM

## 2011-05-19 DIAGNOSIS — K559 Vascular disorder of intestine, unspecified: Secondary | ICD-10-CM

## 2011-05-19 HISTORY — DX: Vascular disorder of intestine, unspecified: K55.9

## 2011-05-19 NOTE — Progress Notes (Signed)
Subjective:    Patient ID: Alyssa Shaw, female    DOB: 1960/05/01, 51 y.o.   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  Pt denies neurological change such as new Headache, facial or extremity weakness.  Pt denies polydipsia, polyuria, or low sugar symptoms. Pt states overall good compliance with treatment and medications, good tolerability, and trying to follow lower cholesterol diet.  Pt denies worsening depressive symptoms, suicidal ideation or panic. No fever, wt loss, night sweats, loss of appetite, or other constitutional symptoms.  Pt states good ability with ADL's, low fall risk, home safety reviewed and adequate, no significant changes in hearing or vision, and occasionally active with exercise.  Did have episode of ischemic colitiis jan 2013, now resolved, but still with significant postevent fatigue Past Medical History  Diagnosis Date  . HYPERTENSION 03/16/2007  . ALLERGIC RHINITIS 03/16/2007  . HYPERLIPIDEMIA 03/17/2010  . DEGENERATIVE JOINT DISEASE, LEFT KNEE 03/11/2009  . BACK PAIN 08/05/2007  . Migraine   . Menopausal syndrome     since 51 yo  . Arthritis   . Pneumonia   . Ischemic colitis 05/19/2011   Past Surgical History  Procedure Date  . Denies surgical history     reports that she has never smoked. She does not have any smokeless tobacco history on file. She reports that she drinks alcohol. She reports that she does not use illicit drugs. family history includes COPD in her mother; Diabetes in her mother; Glaucoma in her mother; Heart murmur in her mother; Hypertension in her sisters; and Prostate cancer in her father. Allergies  Allergen Reactions  . Sulfamethoxazole W/Trimethoprim     REACTION: swelling  . Sulfonamide Derivatives    Current Outpatient Prescriptions on File Prior to Visit    Medication Sig Dispense Refill  . aspirin 81 MG EC tablet Take 81 mg by mouth daily.        . Calcium Carb-Cholecalciferol (CALCIUM 1000 + D PO) Take by mouth daily.        . Cyanocobalamin (VITAMIN B-12 CR PO) Take by mouth daily.        Marland Kitchen estradiol (ESTRACE) 1 MG tablet Take 1 mg by mouth daily.        . Flaxseed, Linseed, (FLAX SEEDS PO) Take by mouth daily.        . Fluocinonide (VANOS) 0.1 % CREA Apply topically 2 (two) times daily as needed. Apply to affected areas       . glycopyrrolate (ROBINUL-FORTE) 2 MG tablet Take 1 tab  Twice daily as needed for cramping.  10 tablet  1  . Multiple Vitamins-Minerals (MULTIVITAMIN,TX-MINERALS) tablet Take 1 tablet by mouth daily.        Marland Kitchen olmesartan (BENICAR) 40 MG tablet Take 40 mg by mouth daily.        . Omega-3 Fatty Acids (FISH OIL PO) Take by mouth daily.        . vitamin C (ASCORBIC ACID) 500 MG tablet Take 500 mg by mouth daily.        . vitamin E 100 UNIT capsule Take 100 Units by mouth daily.         Review of Systems Review of Systems  Constitutional: Negative for diaphoresis, activity change, appetite change and unexpected weight change.  HENT: Negative for hearing loss, ear pain,  facial swelling, mouth sores and neck stiffness.   Eyes: Negative for pain, redness and visual disturbance.  Respiratory: Negative for shortness of breath and wheezing.   Cardiovascular: Negative for chest pain and palpitations.  Gastrointestinal: Negative for diarrhea, blood in stool, abdominal distention and rectal pain.  Genitourinary: Negative for hematuria, flank pain and decreased urine volume.  Musculoskeletal: Negative for myalgias and joint swelling.  Skin: Negative for color change and wound.  Neurological: Negative for syncope and numbness.  Hematological: Negative for adenopathy.  Psychiatric/Behavioral: Negative for hallucinations, self-injury, decreased concentration and agitation.      Objective:   Physical Exam BP 120/62  Pulse 69   Temp(Src) 98.2 F (36.8 C) (Oral)  Ht 5\' 4"  (1.626 m)  Wt 154 lb 4 oz (69.967 kg)  BMI 26.48 kg/m2  SpO2 97% Physical Exam  VS noted Constitutional: Pt is oriented to person, place, and time. Appears well-developed and well-nourished.  HENT:  Head: Normocephalic and atraumatic.  Right Ear: External ear normal.  Left Ear: External ear normal.  Nose: Nose normal.  Mouth/Throat: Oropharynx is clear and moist.  Eyes: Conjunctivae and EOM are normal. Pupils are equal, round, and reactive to light.  Neck: Normal range of motion. Neck supple. No JVD present. No tracheal deviation present.  Cardiovascular: Normal rate, regular rhythm, normal heart sounds and intact distal pulses.   Pulmonary/Chest: Effort normal and breath sounds normal.  Abdominal: Soft. Bowel sounds are normal. There is no tenderness.  Musculoskeletal: Normal range of motion. Exhibits no edema.  Lymphadenopathy:  Has no cervical adenopathy.  Neurological: Pt is alert and oriented to person, place, and time. Pt has normal reflexes. No cranial nerve deficit.  Skin: Skin is warm and dry. No rash noted.  Psychiatric:  Has  normal mood and affect. Behavior is normal.     Assessment & Plan:

## 2011-05-19 NOTE — Assessment & Plan Note (Signed)

## 2011-05-19 NOTE — Patient Instructions (Addendum)
Continue all other medications as before Please follow lower cholesterol diet Please keep your appointments with your specialists as you have planned - the followup mammogram and ultrasound Please have the pharmacy call if you need refills Please return in 1 year for your yearly visit, or sooner if needed, with Lab testing done 3-5 days before

## 2011-05-21 ENCOUNTER — Other Ambulatory Visit: Payer: Self-pay | Admitting: Obstetrics and Gynecology

## 2011-05-21 ENCOUNTER — Ambulatory Visit
Admission: RE | Admit: 2011-05-21 | Discharge: 2011-05-21 | Disposition: A | Discharge status: Home / Self Care | Payer: 59 | Source: Ambulatory Visit | Attending: Obstetrics and Gynecology | Admitting: Obstetrics and Gynecology

## 2011-05-21 DIAGNOSIS — N6310 Unspecified lump in the right breast, unspecified quadrant: Secondary | ICD-10-CM

## 2011-05-21 DIAGNOSIS — R928 Other abnormal and inconclusive findings on diagnostic imaging of breast: Secondary | ICD-10-CM

## 2011-06-08 ENCOUNTER — Ambulatory Visit
Admission: RE | Admit: 2011-06-08 | Discharge: 2011-06-08 | Disposition: A | Discharge status: Home / Self Care | Payer: 59 | Source: Ambulatory Visit | Attending: Obstetrics and Gynecology | Admitting: Obstetrics and Gynecology

## 2011-06-08 ENCOUNTER — Other Ambulatory Visit: Payer: Self-pay | Admitting: Obstetrics and Gynecology

## 2011-06-08 DIAGNOSIS — N6310 Unspecified lump in the right breast, unspecified quadrant: Secondary | ICD-10-CM

## 2011-06-08 HISTORY — PX: BREAST BIOPSY: SHX20

## 2011-06-12 ENCOUNTER — Other Ambulatory Visit: Payer: Self-pay | Admitting: Internal Medicine

## 2011-08-18 LAB — HM PAP SMEAR: HM Pap smear: NEGATIVE

## 2011-10-08 DIAGNOSIS — A609 Anogenital herpesviral infection, unspecified: Secondary | ICD-10-CM

## 2011-10-08 HISTORY — DX: Anogenital herpesviral infection, unspecified: A60.9

## 2012-01-19 ENCOUNTER — Other Ambulatory Visit: Payer: Self-pay | Admitting: Obstetrics and Gynecology

## 2012-01-19 DIAGNOSIS — D249 Benign neoplasm of unspecified breast: Secondary | ICD-10-CM

## 2012-01-28 ENCOUNTER — Ambulatory Visit
Admission: RE | Admit: 2012-01-28 | Discharge: 2012-01-28 | Disposition: A | Discharge status: Home / Self Care | Payer: 59 | Source: Ambulatory Visit | Attending: Obstetrics and Gynecology | Admitting: Obstetrics and Gynecology

## 2012-01-28 DIAGNOSIS — D249 Benign neoplasm of unspecified breast: Secondary | ICD-10-CM

## 2012-04-19 ENCOUNTER — Other Ambulatory Visit: Payer: Self-pay | Admitting: *Deleted

## 2012-04-19 DIAGNOSIS — Z1231 Encounter for screening mammogram for malignant neoplasm of breast: Secondary | ICD-10-CM

## 2012-05-16 ENCOUNTER — Ambulatory Visit: Payer: 59

## 2012-05-17 ENCOUNTER — Other Ambulatory Visit (INDEPENDENT_AMBULATORY_CARE_PROVIDER_SITE_OTHER): Payer: 59

## 2012-05-17 DIAGNOSIS — Z0000 Encounter for general adult medical examination without abnormal findings: Secondary | ICD-10-CM

## 2012-05-17 LAB — CBC WITH DIFFERENTIAL/PLATELET
Basophils Relative: 0.5 % (ref 0.0–3.0)
Eosinophils Relative: 2.9 % (ref 0.0–5.0)
HCT: 37.4 % (ref 36.0–46.0)
Hemoglobin: 13 g/dL (ref 12.0–15.0)
MCHC: 34.6 g/dL (ref 30.0–36.0)
Monocytes Absolute: 0.5 10*3/uL (ref 0.1–1.0)
Monocytes Relative: 10.2 % (ref 3.0–12.0)
Neutrophils Relative %: 53.1 % (ref 43.0–77.0)
RDW: 14.1 % (ref 11.5–14.6)

## 2012-05-17 LAB — HEPATIC FUNCTION PANEL
ALT: 23 U/L (ref 0–35)
AST: 24 U/L (ref 0–37)
Alkaline Phosphatase: 49 U/L (ref 39–117)
Bilirubin, Direct: 0.2 mg/dL (ref 0.0–0.3)
Total Bilirubin: 1 mg/dL (ref 0.3–1.2)
Total Protein: 6.7 g/dL (ref 6.0–8.3)

## 2012-05-17 LAB — BASIC METABOLIC PANEL
CO2: 26 mEq/L (ref 19–32)
Chloride: 99 mEq/L (ref 96–112)
Potassium: 4.1 mEq/L (ref 3.5–5.1)

## 2012-05-17 LAB — URINALYSIS, ROUTINE W REFLEX MICROSCOPIC
Specific Gravity, Urine: <=1.005 (ref 1.000–1.030)
Urine Glucose: NEGATIVE
Urobilinogen, UA: 0.2 (ref 0.0–1.0)

## 2012-05-17 LAB — LIPID PANEL
Cholesterol: 168 mg/dL (ref 0–200)
HDL: 67.3 mg/dL (ref >39.00)
LDL Cholesterol: 84 mg/dL (ref 0–99)
Total CHOL/HDL Ratio: 2
Triglycerides: 83 mg/dL (ref 0.0–149.0)
VLDL: 16.6 mg/dL (ref 0.0–40.0)

## 2012-05-19 ENCOUNTER — Other Ambulatory Visit: Payer: Self-pay | Admitting: Obstetrics and Gynecology

## 2012-05-19 ENCOUNTER — Ambulatory Visit
Admission: RE | Admit: 2012-05-19 | Discharge: 2012-05-19 | Disposition: A | Discharge status: Home / Self Care | Payer: 59 | Source: Ambulatory Visit | Attending: *Deleted | Admitting: *Deleted

## 2012-05-19 DIAGNOSIS — Z1231 Encounter for screening mammogram for malignant neoplasm of breast: Secondary | ICD-10-CM

## 2012-05-23 ENCOUNTER — Encounter: Payer: Self-pay | Admitting: Internal Medicine

## 2012-05-23 ENCOUNTER — Ambulatory Visit (INDEPENDENT_AMBULATORY_CARE_PROVIDER_SITE_OTHER): Payer: 59 | Admitting: Internal Medicine

## 2012-05-23 VITALS — BP 108/78 | HR 68 | Temp 98.2°F | Ht 64.0 in | Wt 163.2 lb

## 2012-05-23 DIAGNOSIS — Z0000 Encounter for general adult medical examination without abnormal findings: Secondary | ICD-10-CM

## 2012-05-23 MED ORDER — OLMESARTAN MEDOXOMIL 40 MG PO TABS: ORAL_TABLET | ORAL | Status: DC

## 2012-05-23 NOTE — Patient Instructions (Addendum)
Your EKG was OK today You are given the copy of your blood work today Please continue all other medications as before, and refills have been done if requested. Please continue your efforts at being more active, low cholesterol diet, and weight control. You are otherwise up to date with prevention measures today. Thank you for enrolling in MyChart. Please follow the instructions below to securely access your online medical record. MyChart allows you to send messages to your doctor, view your test results, renew your prescriptions, schedule appointments, and more. To Log into My Chart online, please go by [employer] or [employer] to [email or web address], or download the MyChart App from the [employer] .  Your Username is: [email or web address] (pass [email or web address]) Please send a practice Message on Mychart later today. Please return in 1 year for your yearly visit, or sooner if needed, with Lab testing done 3-5 days before

## 2012-05-23 NOTE — Assessment & Plan Note (Signed)

## 2012-05-23 NOTE — Progress Notes (Signed)
Subjective:    Patient ID: Alyssa Shaw, female    DOB: September 08, 1960, 52 y.o.   MRN: [id number]  HPI  Here for wellness and f/u;  Overall doing ok;  Pt denies CP, worsening SOB, DOE, wheezing, orthopnea, PND, worsening LE edema, palpitations, dizziness or syncope.  Pt denies neurological change such as new headache, facial or extremity weakness.  Pt denies polydipsia, polyuria, or low sugar symptoms. Pt states overall good compliance with treatment and medications, good tolerability, and has been trying to follow lower cholesterol diet.  Pt denies worsening depressive symptoms, suicidal ideation or panic. No fever, night sweats, wt loss, loss of appetite, or other constitutional symptoms.  Pt states good ability with ADL's, has low fall risk, home safety reviewed and adequate, no other significant changes in hearing or vision, and only occasionally active with exercise.  No acute complaints Past Medical History  Diagnosis Date  . HYPERTENSION 03/16/2007  . ALLERGIC RHINITIS 03/16/2007  . HYPERLIPIDEMIA 03/17/2010  . DEGENERATIVE JOINT DISEASE, LEFT KNEE 03/11/2009  . BACK PAIN 08/05/2007  . Migraine   . Menopausal syndrome     since 52 yo  . Arthritis   . Pneumonia   . Ischemic colitis 05/19/2011   Past Surgical History  Procedure Laterality Date  . Denies surgical history      reports that she has never smoked. She does not have any smokeless tobacco history on file. She reports that  drinks alcohol. She reports that she does not use illicit drugs. family history includes COPD in her mother; Diabetes in her mother; Glaucoma in her mother; Heart murmur in her mother; Hypertension in her sisters; and Prostate cancer in her father. Allergies  Allergen Reactions  . Sulfamethoxazole W-Trimethoprim     REACTION: swelling  . Sulfonamide Derivatives    Current Outpatient Prescriptions on File Prior to Visit  Medication Sig Dispense Refill  . aspirin 81 MG EC tablet Take 81 mg by mouth daily.        .  Calcium Carb-Cholecalciferol (CALCIUM 1000 + D PO) Take by mouth daily.        . Cyanocobalamin (VITAMIN B-12 CR PO) Take by mouth daily.        Marland Kitchen estradiol (ESTRACE) 1 MG tablet Take 1 mg by mouth daily.        . Flaxseed, Linseed, (FLAX SEEDS PO) Take by mouth daily.        . Fluocinonide (VANOS) 0.1 % CREA Apply topically 2 (two) times daily as needed. Apply to affected areas       . glycopyrrolate (ROBINUL-FORTE) 2 MG tablet Take 1 tab  Twice daily as needed for cramping.  10 tablet  1  . Multiple Vitamins-Minerals (MULTIVITAMIN,TX-MINERALS) tablet Take 1 tablet by mouth daily.        . Omega-3 Fatty Acids (FISH OIL PO) Take by mouth daily.        . vitamin C (ASCORBIC ACID) 500 MG tablet Take 500 mg by mouth daily.        . vitamin E 100 UNIT capsule Take 100 Units by mouth daily.         No current facility-administered medications on file prior to visit.   Review of Systems Constitutional: Negative for diaphoresis, activity change, appetite change or unexpected weight change.  HENT: Negative for hearing loss, ear pain, facial swelling, mouth sores and neck stiffness.   Eyes: Negative for pain, redness and visual disturbance.  Respiratory: Negative for shortness of breath and wheezing.  Cardiovascular: Negative for chest pain and palpitations.  Gastrointestinal: Negative for diarrhea, blood in stool, abdominal distention or other pain Genitourinary: Negative for hematuria, flank pain or change in urine volume.  Musculoskeletal: Negative for myalgias and joint swelling.  Skin: Negative for color change and wound.  Neurological: Negative for syncope and numbness. other than noted Hematological: Negative for adenopathy.  Psychiatric/Behavioral: Negative for hallucinations, self-injury, decreased concentration and agitation.      Objective:   Physical Exam BP 108/78  Pulse 68  Temp(Src) 98.2 F (36.8 C) (Oral)  Ht 5\' 4"  (1.626 m)  Wt 163 lb 4 oz (74.05 kg)  BMI 28.01 kg/m2   SpO2 97% VS noted,  Constitutional: Pt is oriented to person, place, and time. Appears well-developed and well-nourished.  Head: Normocephalic and atraumatic.  Right Ear: External ear normal.  Left Ear: External ear normal.  Nose: Nose normal.  Mouth/Throat: Oropharynx is clear and moist.  Eyes: Conjunctivae and EOM are normal. Pupils are equal, round, and reactive to light.  Neck: Normal range of motion. Neck supple. No JVD present. No tracheal deviation present.  Cardiovascular: Normal rate, regular rhythm, normal heart sounds and intact distal pulses.   Pulmonary/Chest: Effort normal and breath sounds normal.  Abdominal: Soft. Bowel sounds are normal. There is no tenderness. No HSM  Musculoskeletal: Normal range of motion. Exhibits no edema.  Lymphadenopathy:  Has no cervical adenopathy.  Neurological: Pt is alert and oriented to person, place, and time. Pt has normal reflexes. No cranial nerve deficit.  Skin: Skin is warm and dry. No rash noted.  Psychiatric:  Has  normal mood and affect. Behavior is normal.     Assessment & Plan:

## 2012-06-06 ENCOUNTER — Encounter: Payer: Self-pay | Admitting: Internal Medicine

## 2012-06-06 ENCOUNTER — Ambulatory Visit (INDEPENDENT_AMBULATORY_CARE_PROVIDER_SITE_OTHER): Payer: 59 | Admitting: Internal Medicine

## 2012-06-06 VITALS — BP 120/82 | HR 66 | Temp 98.1°F | Ht 64.0 in | Wt 155.0 lb

## 2012-06-06 DIAGNOSIS — I10 Essential (primary) hypertension: Secondary | ICD-10-CM

## 2012-06-06 DIAGNOSIS — J209 Acute bronchitis, unspecified: Secondary | ICD-10-CM

## 2012-06-06 MED ORDER — HYDROCODONE-HOMATROPINE 5-1.5 MG/5ML PO SYRP: 5.0000 mL | ORAL_SOLUTION | Freq: Four times a day (QID) | ORAL | Status: DC | PRN

## 2012-06-06 MED ORDER — AZITHROMYCIN 250 MG PO TABS: ORAL_TABLET | ORAL | Status: DC

## 2012-06-06 NOTE — Assessment & Plan Note (Signed)
stable overall by history and exam, recent data reviewed with pt, and pt to continue medical treatment as before,  to f/u any worsening symptoms or concerns BP Readings from Last 3 Encounters:  06/06/12 120/82  05/23/12 108/78  05/19/11 120/62

## 2012-06-06 NOTE — Patient Instructions (Signed)
Please take all new medication as prescribed Please continue all other medications as before, and refills have been done if requested.  

## 2012-06-06 NOTE — Assessment & Plan Note (Signed)
Mild to mod, for antibx course,  to f/u any worsening symptoms or concerns 

## 2012-06-06 NOTE — Progress Notes (Signed)
Subjective:    Patient ID: Neesa Knapik, female    DOB: 1960/06/04, 52 y.o.   MRN: [id number]  HPI  Here with acute onset mild to mod 2-3 days ST, HA, general weakness and malaise, with prod cough greenish sputum, but Pt denies chest pain, increased sob or doe, wheezing, orthopnea, PND, increased LE swelling, palpitations, dizziness or syncope. Past Medical History  Diagnosis Date  . HYPERTENSION 03/16/2007  . ALLERGIC RHINITIS 03/16/2007  . HYPERLIPIDEMIA 03/17/2010  . DEGENERATIVE JOINT DISEASE, LEFT KNEE 03/11/2009  . BACK PAIN 08/05/2007  . Migraine   . Menopausal syndrome     since 52 yo  . Arthritis   . Pneumonia   . Ischemic colitis 05/19/2011   Past Surgical History  Procedure Laterality Date  . Denies surgical history      reports that she has never smoked. She does not have any smokeless tobacco history on file. She reports that  drinks alcohol. She reports that she does not use illicit drugs. family history includes COPD in her mother; Diabetes in her mother; Glaucoma in her mother; Heart murmur in her mother; Hypertension in her sisters; and Prostate cancer in her father. Allergies  Allergen Reactions  . Sulfamethoxazole W-Trimethoprim     REACTION: swelling  . Sulfonamide Derivatives    Current Outpatient Prescriptions on File Prior to Visit  Medication Sig Dispense Refill  . aspirin 81 MG EC tablet Take 81 mg by mouth daily.        . Calcium Carb-Cholecalciferol (CALCIUM 1000 + D PO) Take by mouth daily.        . Cyanocobalamin (VITAMIN B-12 CR PO) Take by mouth daily.        Marland Kitchen estradiol (ESTRACE) 1 MG tablet Take 1 mg by mouth daily.        . Flaxseed, Linseed, (FLAX SEEDS PO) Take by mouth daily.        . Fluocinonide (VANOS) 0.1 % CREA Apply topically 2 (two) times daily as needed. Apply to affected areas       . glycopyrrolate (ROBINUL-FORTE) 2 MG tablet Take 1 tab  Twice daily as needed for cramping.  10 tablet  1  . Multiple Vitamins-Minerals  (MULTIVITAMIN,TX-MINERALS) tablet Take 1 tablet by mouth daily.        Marland Kitchen olmesartan (BENICAR) 40 MG tablet TAKE 1/2 TABLET DAILY  90 tablet  3  . Omega-3 Fatty Acids (FISH OIL PO) Take by mouth daily.        . valACYclovir (VALTREX) 500 MG tablet Take 500 mg by mouth daily.      . vitamin C (ASCORBIC ACID) 500 MG tablet Take 500 mg by mouth daily.        . vitamin E 100 UNIT capsule Take 100 Units by mouth daily.         No current facility-administered medications on file prior to visit.   Review of Systems All otherwise neg per pt     Objective:   Physical Exam BP 120/82  Pulse 66  Temp(Src) 98.1 F (36.7 C) (Oral)  Ht 5\' 4"  (1.626 m)  Wt 155 lb (70.308 kg)  BMI 26.59 kg/m2  SpO2 97% VS noted, mild ill Constitutional: Pt appears well-developed and well-nourished.  HENT: Head: NCAT.  Right Ear: External ear normal.  Left Ear: External ear normal.  Bilat tm's with mild erythema.  Max sinus areas mild tender.  Pharynx with mild erythema, no exudate Eyes: Conjunctivae and EOM are normal. Pupils are equal, round, and  reactive to light.  Neck: Normal range of motion. Neck supple.  Cardiovascular: Normal rate and regular rhythm.   Pulmonary/Chest: Effort normal and breath sounds normal.  Neurological: Pt is alert. Not confused  Skin: Skin is warm. No erythema.  Psychiatric: Pt behavior is normal. Thought content normal.     Assessment & Plan:

## 2012-07-05 ENCOUNTER — Encounter: Payer: Self-pay | Admitting: Internal Medicine

## 2012-07-05 MED ORDER — PREDNISONE 10 MG PO TABS: ORAL_TABLET | ORAL | Status: DC

## 2012-08-16 ENCOUNTER — Encounter: Payer: Self-pay | Admitting: *Deleted

## 2012-08-23 ENCOUNTER — Encounter: Payer: Self-pay | Admitting: Nurse Practitioner

## 2012-08-23 ENCOUNTER — Ambulatory Visit (INDEPENDENT_AMBULATORY_CARE_PROVIDER_SITE_OTHER): Payer: 59 | Admitting: Nurse Practitioner

## 2012-08-23 VITALS — BP 132/70 | HR 74 | Resp 14 | Ht 65.0 in | Wt 154.4 lb

## 2012-08-23 DIAGNOSIS — Z01419 Encounter for gynecological examination (general) (routine) without abnormal findings: Secondary | ICD-10-CM

## 2012-08-23 MED ORDER — VALACYCLOVIR HCL 500 MG PO TABS: 500.0000 mg | ORAL_TABLET | Freq: Every day | ORAL | Status: DC

## 2012-08-23 MED ORDER — ALUMINUM CHLORIDE 20 % EX SOLN: Freq: Every day | CUTANEOUS | Status: DC

## 2012-08-23 MED ORDER — ESTRADIOL 1 MG PO TABS: 1.0000 mg | ORAL_TABLET | Freq: Every day | ORAL | Status: DC

## 2012-08-23 NOTE — Progress Notes (Signed)
52 y.o. G1P1 Married Caucasian Fe here for annual exam.  Mirena IUD to be removed 05/2013. Doing well on Estradiol and wants to continue. Only rare problems with HSV outbreak takes Valtrex daily. Plans to retire in 15 months.  [place] weekend. He is now 9 years out from his prostate cancer.  No LMP recorded. Patient is not currently having periods (Reason: IUD).          Sexually active: yes  The current method of family planning is IUD.    Exercising: yes  a lot of walking  Smoker:  no  Health Maintenance: Pap:  08/18/2011  Normal  MMG:  05/19/2012 normal Colonoscopy:  05/2009 negative recheck in 10 year BMD:   never TDaP:  2011 (PCP) Labs: PCP does labs (blood) work and checks urine.    reports that she has never smoked. She does not have any smokeless tobacco history on file. She reports that she drinks about 2.0 ounces of alcohol per week. She reports that she does not use illicit drugs.  Past Medical History  Diagnosis Date  . HYPERTENSION 03/16/2007  . ALLERGIC RHINITIS 03/16/2007  . HYPERLIPIDEMIA 03/17/2010  . DEGENERATIVE JOINT DISEASE, LEFT KNEE 03/11/2009  . BACK PAIN 08/05/2007  . Migraine   . Menopausal syndrome     since 52 yo  . Arthritis   . Pneumonia   . Ischemic colitis 05/19/2011  . Abnormal Pap smear 07/2007    LGSIL  . STD (sexually transmitted disease)     HSV II    Past Surgical History  Procedure Laterality Date  . Denies surgical history    . Core needle bx Right     fibroadenoma  . Vaginal delivery      Current Outpatient Prescriptions  Medication Sig Dispense Refill  . aspirin 81 MG EC tablet Take 81 mg by mouth daily.        . Calcium Carb-Cholecalciferol (CALCIUM 1000 + D PO) Take by mouth daily.        . Cyanocobalamin (VITAMIN B-12 CR PO) Take by mouth daily.        Marland Kitchen estradiol (ESTRACE) 1 MG tablet Take 1 mg by mouth daily.        . Flaxseed, Linseed, (FLAX SEEDS PO) Take by mouth daily.        Marland Kitchen  glycopyrrolate (ROBINUL-FORTE) 2 MG tablet Take 1 tab  Twice daily as needed for cramping.  10 tablet  1  . Multiple Vitamins-Minerals (MULTIVITAMIN,TX-MINERALS) tablet Take 1 tablet by mouth daily.        Marland Kitchen olmesartan (BENICAR) 40 MG tablet TAKE 1/2 TABLET DAILY  90 tablet  3  . Omega-3 Fatty Acids (FISH OIL PO) Take by mouth daily.        . valACYclovir (VALTREX) 500 MG tablet Take 500 mg by mouth daily.      . vitamin C (ASCORBIC ACID) 500 MG tablet Take 500 mg by mouth daily.        . vitamin E 100 UNIT capsule Take 100 Units by mouth daily.         No current facility-administered medications for this visit.    Family History  Problem Relation Age of Onset  . Diabetes Mother   . Heart murmur Mother   . Glaucoma Mother   . Hypertension Sister   . Hypertension Sister   . Hypertension Sister   . COPD Mother   . Prostate cancer Father  ROS:  Pertinent items are noted in HPI.  Otherwise, a comprehensive ROS was negative.  Exam:   BP 132/70  Pulse 74  Resp 14  Ht 5\' 5"  (1.651 m)  Wt 154 lb 6.4 oz (70.035 kg)  BMI 25.69 kg/m2 Height: 5\' 5"  (165.1 cm)  Ht Readings from Last 3 Encounters:  08/23/12 5\' 5"  (1.651 m)  06/06/12 5\' 4"  (1.626 m)  05/23/12 5\' 4"  (1.626 m)    General appearance: alert, cooperative and appears stated age Head: Normocephalic, without obvious abnormality, atraumatic Neck: no adenopathy, supple, symmetrical, trachea midline and thyroid normal to inspection and palpation Lungs: clear to auscultation bilaterally Breasts: normal appearance, no masses or tenderness Heart: regular rate and rhythm Abdomen: soft, non-tender; no masses,  no organomegaly Extremities: extremities normal, atraumatic, no cyanosis or edema Skin: Skin color, texture, turgor normal. No rashes or lesions Lymph nodes: Cervical, supraclavicular, and axillary nodes normal. No abnormal inguinal nodes palpated Neurologic: Grossly normal   Pelvic: External genitalia:  no lesions               Urethra:  normal appearing urethra with no masses, tenderness or lesions              Bartholin's and Skene's: normal                 Vagina: normal appearing vagina with normal color and discharge, no lesions              Cervix: anteverted and IUD strings are visible              Pap taken: yes Bimanual Exam:  Uterus:  normal size, contour, position, consistency, mobility, non-tender              Adnexa: no mass, fullness, tenderness               Rectovaginal: Confirms               Anus:  normal sphincter tone, no lesions  A:  Well Woman with normal exam  Perimenopausal on HRT with  Mirena IUD 05/25/08 ( for removal next year)    and Estradiol  History of abnormal pap with Colpo Biopsy 2005, 2008, 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

## 2012-08-23 NOTE — Patient Instructions (Signed)

## 2012-08-25 NOTE — Progress Notes (Signed)
Encounter reviewed by Dr. Brook Silva.  

## 2012-09-27 ENCOUNTER — Encounter: Payer: Self-pay | Admitting: Internal Medicine

## 2012-09-27 MED ORDER — NEOMYCIN-POLYMYXIN-HC 3.5-10000-1 OT SOLN: 3.0000 [drp] | Freq: Four times a day (QID) | OTIC | Status: DC

## 2012-09-27 NOTE — Telephone Encounter (Signed)
See note above,   Robin to let pt know as well

## 2012-11-29 ENCOUNTER — Encounter: Payer: Self-pay | Admitting: Internal Medicine

## 2012-11-30 MED ORDER — AZITHROMYCIN 250 MG PO TABS: ORAL_TABLET | ORAL | Status: DC

## 2013-01-12 ENCOUNTER — Other Ambulatory Visit: Payer: Self-pay

## 2013-02-13 IMAGING — CT CT ABD-PELV W/ CM
2 of 5 series · 17 of 46 positions shown, 19 images · IV contrast (Omnipaque 300)
Comparison: None.

CLINICAL DATA: Blood in stools, left lower quadrant pain

CT ABDOMEN AND PELVIS WITH CONTRAST
TECHNIQUE: Multidetector CT imaging of the abdomen and pelvis was
performed following the standard protocol during bolus
administration of intravenous contrast.
Contrast: 100mL OMNIPAQUE IOHEXOL 300 MG/ML IV SOLN

[Series 2: abd/ pel 5mm · axial · 0.62mm/px · z∈[-472,-62]mm · 14 of 92 slices shown, 16 images]
[im 5/92  soft-tissue]
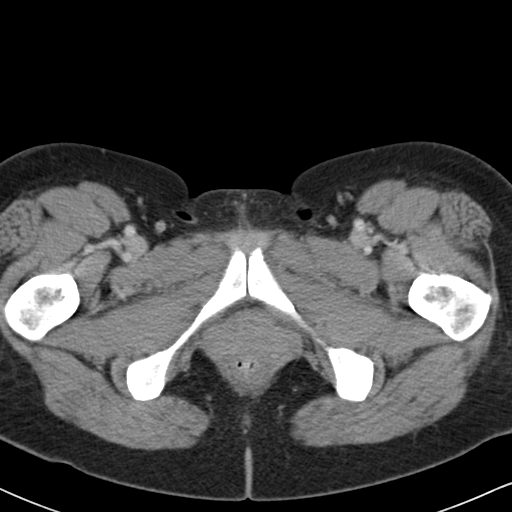
[im 5/92  bone]
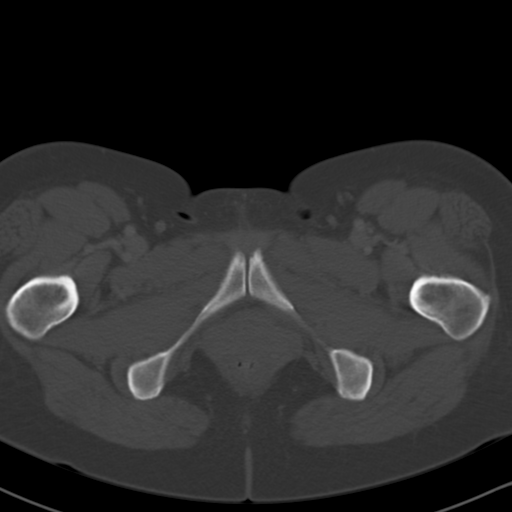
[im 14/92  soft-tissue]
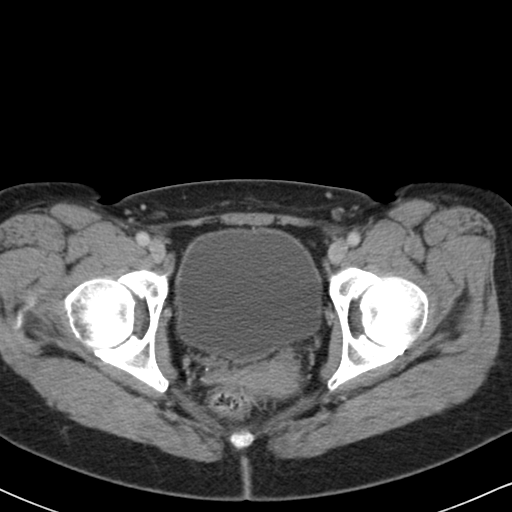
[im 19/92  soft-tissue]
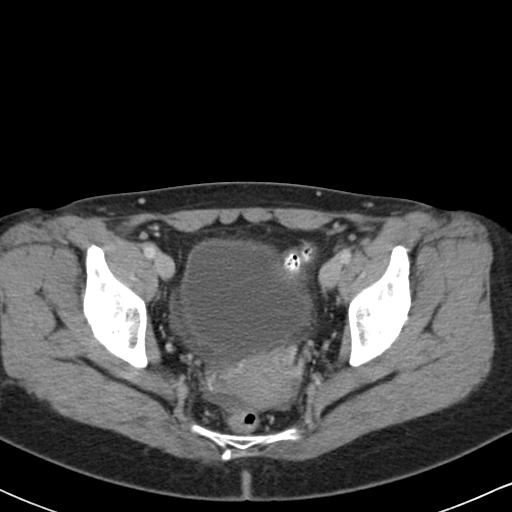
[im 23/92  soft-tissue]
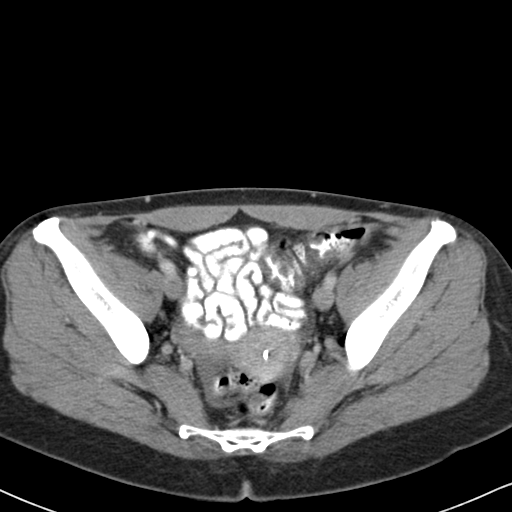
[im 32/92  soft-tissue]
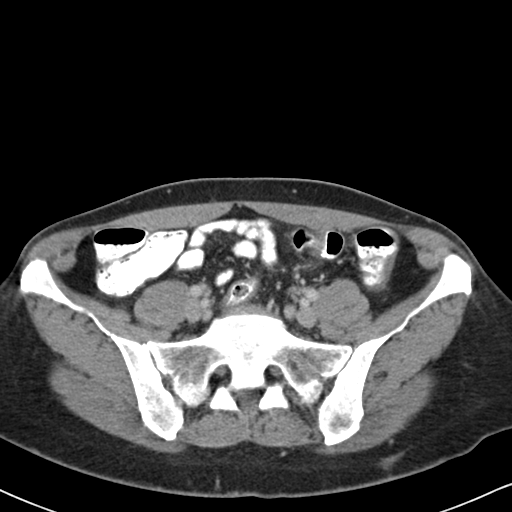
[im 37/92  soft-tissue]
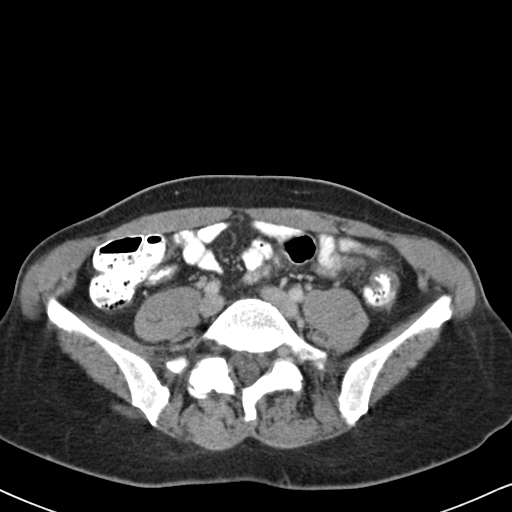
[im 41/92  soft-tissue]
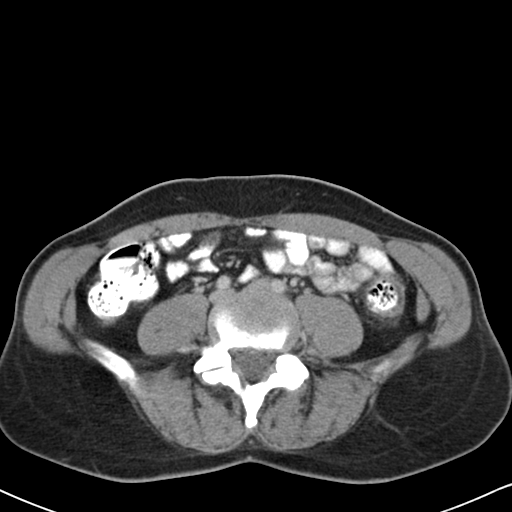
[im 51/92  soft-tissue]
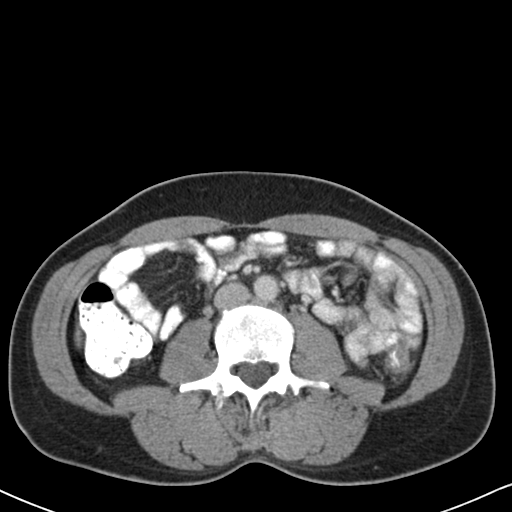
[im 55/92  soft-tissue]
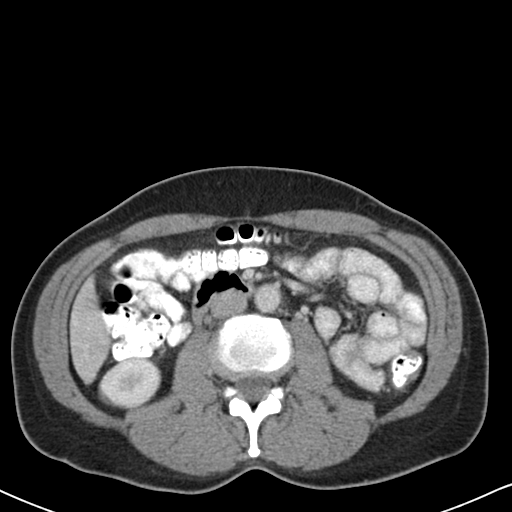
[im 55/92  bone]
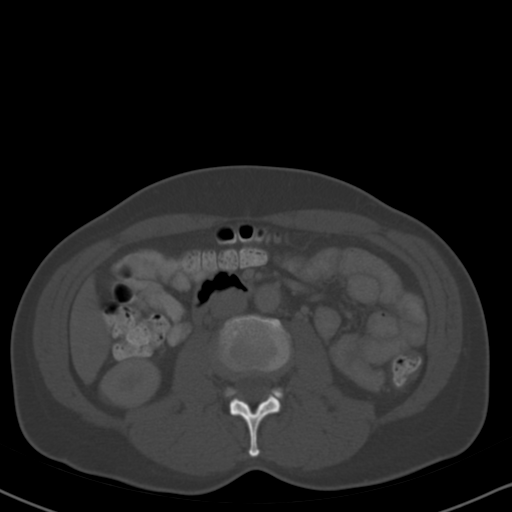
[im 60/92  soft-tissue]
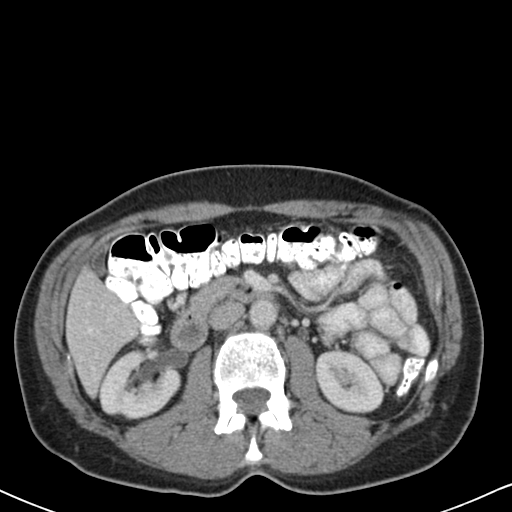
[im 69/92  soft-tissue]
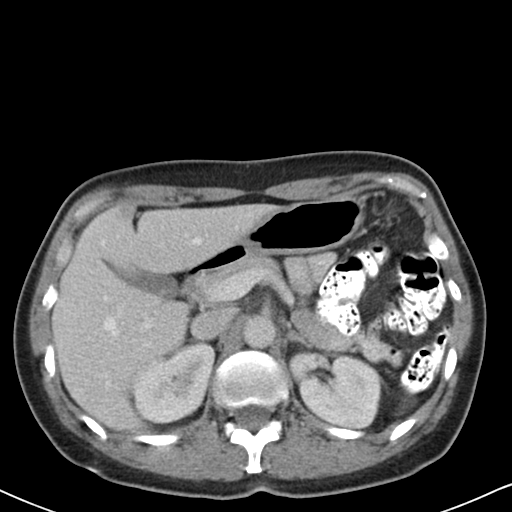
[im 73/92  soft-tissue]
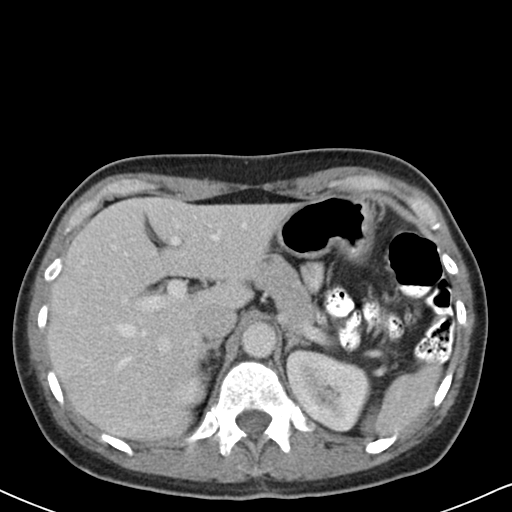
[im 78/92  soft-tissue]
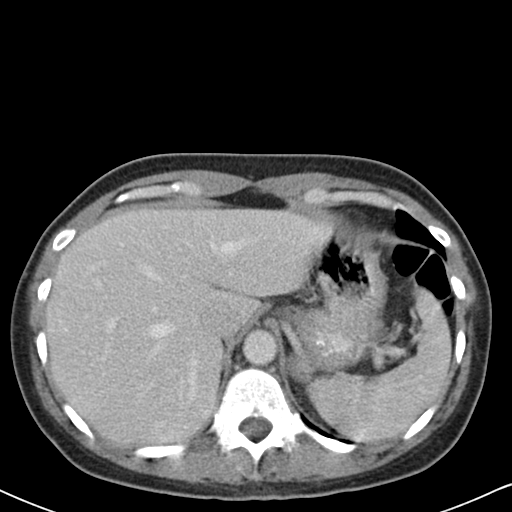
[im 87/92  soft-tissue]
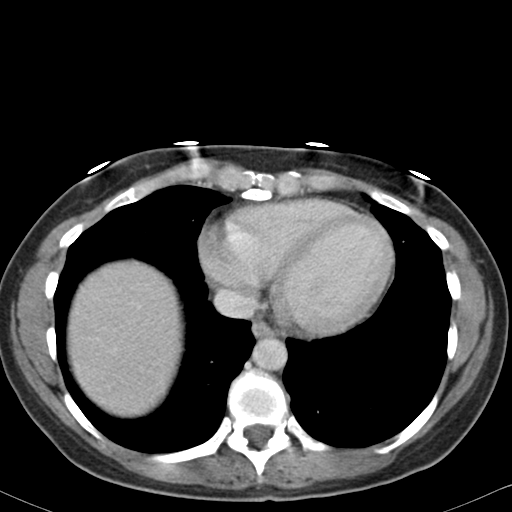

[Series 602: cor · coronal · 0.93mm/px · 3 of 92 slices shown]
[im 31/92  soft-tissue]
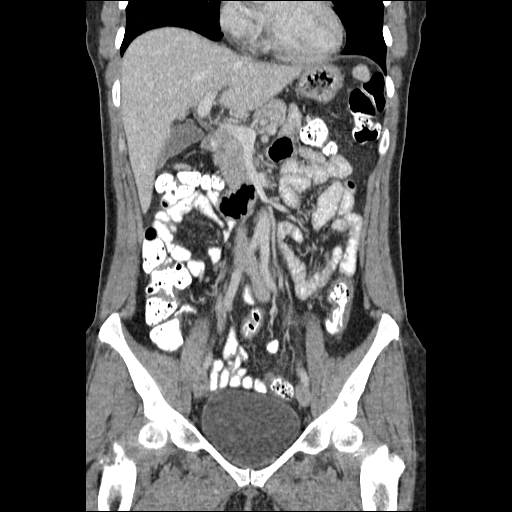
[im 41/92  soft-tissue]
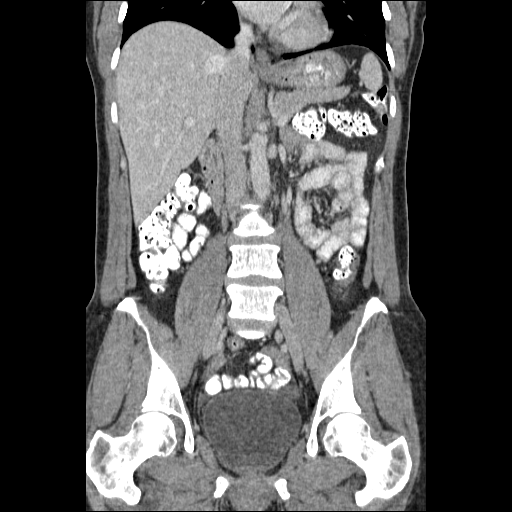
[im 51/92  soft-tissue]
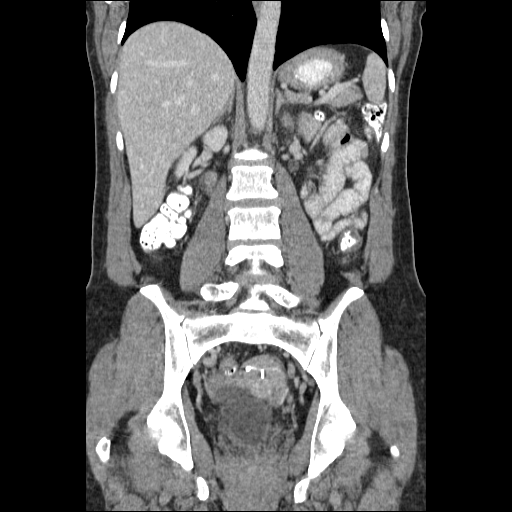

[17 of 46 positions shown; findings below may reference images not displayed]

FINDINGS: The lung bases are clear.  The liver, gallbladder,
spleen, pancreas, adrenal glands, kidneys, urinary bladder have a
normal appearance.  There is an IUD within the uterus.  The adnexal
regions are unremarkable.  There are degenerative changes at the
lower lumbar spine.

There is very mild wall thickening and pericolonic stranding
involving the descending and sigmoid colon.  No diverticuli are
identified.  The mesenteric vessels are patent.  There is no
evidence of bowel obstruction.  The appendix is seen in the right
lower quadrant, and has a normal appearance.  There is no
adenopathy, free fluid, or pneumoperitoneum.
IMPRESSION: There is very mild wall thickening and pericolonic stranding
involving the descending and sigmoid colon.  There are no
diverticuli and the mesenteric vessels are patent, so the findings
may be related to resolving infectious or pseudomembranous colitis.

## 2013-04-17 ENCOUNTER — Other Ambulatory Visit: Payer: Self-pay

## 2013-04-17 DIAGNOSIS — Z1231 Encounter for screening mammogram for malignant neoplasm of breast: Secondary | ICD-10-CM

## 2013-05-15 ENCOUNTER — Other Ambulatory Visit (INDEPENDENT_AMBULATORY_CARE_PROVIDER_SITE_OTHER): Payer: 59

## 2013-05-15 DIAGNOSIS — Z0000 Encounter for general adult medical examination without abnormal findings: Secondary | ICD-10-CM

## 2013-05-15 LAB — CBC WITH DIFFERENTIAL/PLATELET
BASOS PCT: 0.6 % (ref 0.0–3.0)
Basophils Absolute: 0 10*3/uL (ref 0.0–0.1)
EOS ABS: 0.2 10*3/uL (ref 0.0–0.7)
EOS PCT: 3.4 % (ref 0.0–5.0)
HEMATOCRIT: 37.6 % (ref 36.0–46.0)
Hemoglobin: 12.6 g/dL (ref 12.0–15.0)
LYMPHS ABS: 2.2 10*3/uL (ref 0.7–4.0)
Lymphocytes Relative: 35.3 % (ref 12.0–46.0)
MCHC: 33.6 g/dL (ref 30.0–36.0)
MCV: 95.3 fl (ref 78.0–100.0)
MONO ABS: 0.5 10*3/uL (ref 0.1–1.0)
Monocytes Relative: 8.8 % (ref 3.0–12.0)
NEUTROS PCT: 51.9 % (ref 43.0–77.0)
Neutro Abs: 3.2 10*3/uL (ref 1.4–7.7)
PLATELETS: 256 10*3/uL (ref 150.0–400.0)
RBC: 3.95 Mil/uL (ref 3.87–5.11)
RDW: 13.6 % (ref 11.5–14.6)
WBC: 6.1 10*3/uL (ref 4.5–10.5)

## 2013-05-15 LAB — URINALYSIS, ROUTINE W REFLEX MICROSCOPIC
Bilirubin Urine: NEGATIVE
HGB URINE DIPSTICK: NEGATIVE
Ketones, ur: NEGATIVE
Nitrite: NEGATIVE
RBC / HPF: NONE SEEN (ref 0–?)
SPECIFIC GRAVITY, URINE: 1.01 (ref 1.000–1.030)
Total Protein, Urine: NEGATIVE
URINE GLUCOSE: NEGATIVE
UROBILINOGEN UA: 0.2 (ref 0.0–1.0)
pH: 7 (ref 5.0–8.0)

## 2013-05-15 LAB — HEPATIC FUNCTION PANEL
ALT: 20 U/L (ref 0–35)
AST: 22 U/L (ref 0–37)
Albumin: 4.3 g/dL (ref 3.5–5.2)
Alkaline Phosphatase: 42 U/L (ref 39–117)
Bilirubin, Direct: 0.1 mg/dL (ref 0.0–0.3)
TOTAL PROTEIN: 7.2 g/dL (ref 6.0–8.3)
Total Bilirubin: 0.7 mg/dL (ref 0.3–1.2)

## 2013-05-15 LAB — BASIC METABOLIC PANEL
BUN: 12 mg/dL (ref 6–23)
CHLORIDE: 100 meq/L (ref 96–112)
CO2: 28 mEq/L (ref 19–32)
Calcium: 8.7 mg/dL (ref 8.4–10.5)
Creatinine, Ser: 0.8 mg/dL (ref 0.4–1.2)
GFR: 78.18 mL/min (ref >60.00)
Glucose, Bld: 84 mg/dL (ref 70–99)
POTASSIUM: 3.8 meq/L (ref 3.5–5.1)
SODIUM: 136 meq/L (ref 135–145)

## 2013-05-15 LAB — LIPID PANEL
CHOLESTEROL: 176 mg/dL (ref 0–200)
HDL: 67.9 mg/dL (ref >39.00)
LDL CALC: 88 mg/dL (ref 0–99)
TRIGLYCERIDES: 103 mg/dL (ref 0.0–149.0)
Total CHOL/HDL Ratio: 3
VLDL: 20.6 mg/dL (ref 0.0–40.0)

## 2013-05-15 LAB — TSH: TSH: 1.53 u[IU]/mL (ref 0.35–5.50)

## 2013-05-22 ENCOUNTER — Ambulatory Visit
Admission: RE | Admit: 2013-05-22 | Discharge: 2013-05-22 | Disposition: A | Discharge status: Home / Self Care | Payer: 59 | Source: Ambulatory Visit

## 2013-05-22 DIAGNOSIS — Z1231 Encounter for screening mammogram for malignant neoplasm of breast: Secondary | ICD-10-CM

## 2013-05-22 LAB — HM MAMMOGRAPHY

## 2013-05-23 ENCOUNTER — Telehealth: Payer: Self-pay | Admitting: Nurse Practitioner

## 2013-05-23 DIAGNOSIS — Z30432 Encounter for removal of intrauterine contraceptive device: Secondary | ICD-10-CM

## 2013-05-23 NOTE — Telephone Encounter (Signed)
Spoke with pt whose IUD expires this month. Pt did not think PG recommended it being replaced with a new one, as pt is perimenopausal. Sched appt 06-05-13 at 1:00 with BS.  Patty, does pt need replacement of her IUD? I have her scheduled for removal.

## 2013-05-23 NOTE — Telephone Encounter (Signed)
Just removal because she is 52.

## 2013-05-23 NOTE — Telephone Encounter (Signed)
Pt's iud expires this week. Should she go ahead and schedule an appointment for that?

## 2013-05-23 NOTE — Telephone Encounter (Signed)
IUD order removal sent for precert.

## 2013-05-25 ENCOUNTER — Ambulatory Visit (INDEPENDENT_AMBULATORY_CARE_PROVIDER_SITE_OTHER): Payer: 59 | Admitting: Internal Medicine

## 2013-05-25 ENCOUNTER — Encounter: Payer: Self-pay | Admitting: Internal Medicine

## 2013-05-25 VITALS — BP 122/80 | HR 70 | Temp 97.9°F | Ht 64.0 in | Wt 162.0 lb

## 2013-05-25 DIAGNOSIS — Z0000 Encounter for general adult medical examination without abnormal findings: Secondary | ICD-10-CM

## 2013-05-25 DIAGNOSIS — I10 Essential (primary) hypertension: Secondary | ICD-10-CM

## 2013-05-25 MED ORDER — OLMESARTAN MEDOXOMIL 40 MG PO TABS: ORAL_TABLET | ORAL | Status: DC

## 2013-05-25 MED ORDER — OLMESARTAN MEDOXOMIL 40 MG PO TABS: ORAL_TABLET | ORAL | Status: AC

## 2013-05-25 NOTE — Progress Notes (Signed)
Pre visit review using our clinic review tool, if applicable. No additional management support is needed unless otherwise documented below in the visit note. 

## 2013-05-25 NOTE — Assessment & Plan Note (Signed)

## 2013-05-25 NOTE — Progress Notes (Signed)
Subjective:    Patient ID: Alyssa Shaw, female    DOB: 04/13/1960, 53 y.o.   MRN: [id number]  HPI   Here for wellness and f/u;  Overall doing ok;  Pt denies CP, worsening SOB, DOE, wheezing, orthopnea, PND, worsening LE edema, palpitations, dizziness or syncope.  Pt denies neurological change such as new headache, facial or extremity weakness.  Pt denies polydipsia, polyuria, or low sugar symptoms. Pt states overall good compliance with treatment and medications, good tolerability, and has been trying to follow lower cholesterol diet.  Pt denies worsening depressive symptoms, suicidal ideation or panic. No fever, night sweats, wt loss, loss of appetite, or other constitutional symptoms.  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 To have IUD removed later this month, then pap due just after. No new complaints Past Medical History  Diagnosis Date  . HYPERTENSION 03/16/2007  . ALLERGIC RHINITIS 03/16/2007  . HYPERLIPIDEMIA 03/17/2010  . DEGENERATIVE JOINT DISEASE, LEFT KNEE 03/11/2009  . BACK PAIN 08/05/2007  . Migraine   . Menopausal syndrome     since 53 yo  . Arthritis   . Pneumonia   . Ischemic colitis 05/19/2011  . Abnormal Pap smear 1/05; 2/08; 07/2007    LGSIL Colpo Bx. CIN I  . STD (sexually transmitted disease)     HSV II  . 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 Vaginal delivery    . Intrauterine device insertion  05/25/08    Mirena  . 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 She has never used smokeless tobacco. She reports that she drinks about 2.0 ounces of alcohol per week. She reports that she does not use  illicit drugs. family history includes COPD in her mother; Diabetes in her mother; Glaucoma in her mother; Heart murmur in her mother; Hypertension in her sister, sister, and sister; Prostate cancer in her father. Allergies  Allergen Reactions  . Septra [Sulfamethoxazole-Trimethoprim]     REACTION: swelling  . Sulfonamide Derivatives    Current Outpatient Prescriptions on File Prior to Visit  Medication Sig Dispense Refill  . aluminum chloride (DRYSOL) 20 % external solution Apply topically at bedtime.  35 mL  5  . aspirin 81 MG EC tablet Take 81 mg by mouth daily.        . Calcium Carb-Cholecalciferol (CALCIUM 1000 + D PO) Take by mouth daily.        . Cyanocobalamin (VITAMIN B-12 CR PO) Take by mouth daily.        Marland Kitchen estradiol (ESTRACE) 1 MG tablet Take 1 tablet (1 mg total) by mouth daily.  90 tablet  3  . Flaxseed, Linseed, (FLAX SEEDS PO) Take by mouth daily.        Marland Kitchen glycopyrrolate (ROBINUL-FORTE) 2 MG tablet Take 1 tab  Twice daily as needed for cramping.  10 tablet  1  . Multiple Vitamins-Minerals (MULTIVITAMIN,TX-MINERALS) tablet Take 1 tablet by mouth daily.        Marland Kitchen neomycin-polymyxin-hydrocortisone (CORTISPORIN) otic solution Place 3 drops into the left ear 4 (four) times daily.  10 mL  0  . Omega-3 Fatty Acids (FISH  OIL PO) Take by mouth daily.        . valACYclovir (VALTREX) 500 MG tablet Take 1 tablet (500 mg total) by mouth daily.  90 tablet  3  . vitamin C (ASCORBIC ACID) 500 MG tablet Take 500 mg by mouth daily.        . vitamin E 100 UNIT capsule Take 100 Units by mouth daily.        Marland Kitchen azithromycin (ZITHROMAX Z-PAK) 250 MG tablet Use as directed  6 each  1   No current facility-administered medications on file prior to visit.   . Review of Systems Constitutional: Negative for diaphoresis, activity change, appetite change or unexpected weight change.  HENT: Negative for hearing loss, ear pain, facial swelling, mouth sores and neck stiffness.   Eyes: Negative for pain,  redness and visual disturbance.  Respiratory: Negative for shortness of breath and wheezing.   Cardiovascular: Negative for chest pain and palpitations.  Gastrointestinal: Negative for diarrhea, blood in stool, abdominal distention or other pain Genitourinary: Negative for hematuria, flank pain or change in urine volume.  Musculoskeletal: Negative for myalgias and joint swelling.  Skin: Negative for color change and wound.  Neurological: Negative for syncope and numbness. other than noted Hematological: Negative for adenopathy.  Psychiatric/Behavioral: Negative for hallucinations, self-injury, decreased concentration and agitation.      Objective:   Physical Exam BP 122/80  Pulse 70  Temp(Src) 97.9 F (36.6 C) (Oral)  Wt 162 lb (73.483 kg)  SpO2 97% VS noted,  Constitutional: Pt is oriented to person, place, and time. Appears well-developed and well-nourished.  Head: Normocephalic and atraumatic.  Right Ear: External ear normal.  Left Ear: External ear normal.  Nose: Nose normal.  Mouth/Throat: Oropharynx is clear and moist.  Eyes: Conjunctivae and EOM are normal. Pupils are equal, round, and reactive to light.  Neck: Normal range of motion. Neck supple. No JVD present. No tracheal deviation present.  Cardiovascular: Normal rate, regular rhythm, normal heart sounds and intact distal pulses.   Pulmonary/Chest: Effort normal and breath sounds normal.  Abdominal: Soft. Bowel sounds are normal. There is no tenderness. No HSM  Musculoskeletal: Normal range of motion. Exhibits no edema.  Lymphadenopathy:  Has no cervical adenopathy.  Neurological: Pt is alert and oriented to person, place, and time. Pt has normal reflexes. No cranial nerve deficit.  Skin: Skin is warm and dry. No rash noted.  Psychiatric:  Has  normal mood and affect. Behavior is normal.     Assessment & Plan:

## 2013-05-25 NOTE — Assessment & Plan Note (Signed)
stable overall by history and exam, recent data reviewed with pt, and pt to continue medical treatment as before,  to f/u any worsening symptoms or concerns BP Readings from Last 3 Encounters:  05/25/13 122/80  08/23/12 132/70  06/06/12 120/82

## 2013-05-25 NOTE — Patient Instructions (Signed)
Please continue all other medications as before, and refills have been done if requested. Please have the pharmacy call with any other refills you may need.  Please continue your efforts at being more active, low cholesterol diet, and weight control. You are otherwise up to date with prevention measures today.  Please keep your appointments with your specialists as you have planned  You are given the EKG and lab work results today  Please return in 1 year for your yearly visit, or sooner if needed, with Lab testing done 3-5 days before

## 2013-05-26 ENCOUNTER — Telehealth: Payer: Self-pay | Admitting: Internal Medicine

## 2013-05-26 NOTE — Telephone Encounter (Signed)
Pre-cert complete. Patient will have 0 responsibility for removal of iud.

## 2013-05-26 NOTE — Telephone Encounter (Signed)
Relevant patient education assigned to patient using Emmi. ° °

## 2013-05-26 NOTE — Telephone Encounter (Signed)
LMTCB

## 2013-05-29 NOTE — Telephone Encounter (Signed)
Spoke with pt to let her know PG confirmed she will only have IUD removal and not replacement. Also informed pt she will owe $0 OOP for removal. Pt appreciative.

## 2013-06-05 ENCOUNTER — Encounter: Payer: Self-pay | Admitting: Obstetrics and Gynecology

## 2013-06-05 ENCOUNTER — Ambulatory Visit (INDEPENDENT_AMBULATORY_CARE_PROVIDER_SITE_OTHER): Payer: 59 | Admitting: Obstetrics and Gynecology

## 2013-06-05 VITALS — BP 128/70 | HR 70 | Ht 65.0 in | Wt 152.0 lb

## 2013-06-05 DIAGNOSIS — Z30432 Encounter for removal of intrauterine contraceptive device: Secondary | ICD-10-CM

## 2013-06-05 DIAGNOSIS — Z79890 Hormone replacement therapy (postmenopausal): Secondary | ICD-10-CM

## 2013-06-05 HISTORY — PX: IUD REMOVAL: SHX5392

## 2013-06-05 NOTE — Progress Notes (Signed)
Patient ID: Alyssa Shaw, female   DOB: October 04, 1960, 53 y.o.   MRN: [id number] GYNECOLOGY  VISIT   HPI: 53 y.o.   Married  Caucasian  female   G1P1 with No LMP recorded. Patient is not currently having periods (Reason: IUD).   here for  IUD removal.   Mirena IUD placed March 2010.  Patient is on Estradiol 1 mg daily also.   Has been on the Estradiol since starting the Mirena years ago.  [place] 75.1 on 03/28/04.     Has a breast biopsy last year and this was confirmed to be a cyst.   Patient is a [employer].  GYNECOLOGIC HISTORY: No LMP recorded. Patient is not currently having periods (Reason: IUD). Contraception:   IUD Menopausal hormone therapy: Estradiol 1 mg        OB History   Grav Para Term Preterm Abortions TAB SAB Ect Mult Living   1 1        1          Patient Active Problem List   Diagnosis Date Noted  . Ischemic colitis 05/19/2011  . Preventative health care 06/23/2010  . HYPERLIPIDEMIA 03/17/2010  . DEGENERATIVE JOINT DISEASE, LEFT KNEE 03/11/2009  . BACK PAIN 08/05/2007  . HYPERTENSION 03/16/2007  . ALLERGIC RHINITIS 03/16/2007    Past Medical History  Diagnosis Date  . HYPERTENSION 03/16/2007  . ALLERGIC RHINITIS 03/16/2007  . HYPERLIPIDEMIA 03/17/2010  . DEGENERATIVE JOINT DISEASE, LEFT KNEE 03/11/2009  . BACK PAIN 08/05/2007  . Migraine   . Menopausal syndrome     since 53 yo  . Arthritis   . Pneumonia   . Ischemic colitis 05/19/2011  . Abnormal Pap smear 1/05; 2/08; 07/2007    LGSIL Colpo Bx. CIN I  . STD (sexually transmitted disease)     HSV II  . 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 Vaginal delivery    . Intrauterine device insertion  05/25/08    Mirena  . Breast biopsy Right 06/08/11    Fibroadenoma    Current Outpatient Prescriptions  Medication Sig Dispense Refill  . aluminum chloride (DRYSOL) 20 %  external solution Apply topically at bedtime.  35 mL  5  . aspirin 81 MG EC tablet Take 81 mg by mouth daily.        . Biotin (BIOTIN 5000) 5 MG CAPS Take by mouth 2 (two) times daily.      . Calcium Carb-Cholecalciferol (CALCIUM 1000 + D PO) Take by mouth daily.        . Cyanocobalamin (VITAMIN B-12 CR PO) Take by mouth daily.        Marland Kitchen estradiol (ESTRACE) 1 MG tablet Take 1 tablet (1 mg total) by mouth daily.  90 tablet  3  . GARLIC PO Take by mouth daily.      . Multiple Vitamins-Minerals (MULTIVITAMIN,TX-MINERALS) tablet Take 1 tablet by mouth daily.        Marland Kitchen olmesartan (BENICAR) 40 MG tablet TAKE 1/2 TABLET DAILY  90 tablet  3  . Omega-3 Fatty Acids (FISH OIL PO) Take by mouth daily.        . valACYclovir (VALTREX) 500 MG tablet Take 1 tablet (500 mg total) by mouth daily.  90 tablet  3  . vitamin C (ASCORBIC ACID) 500 MG tablet Take 500 mg by mouth daily.        Marland Kitchen  vitamin E 100 UNIT capsule Take 100 Units by mouth daily.         No current facility-administered medications for this visit.     ALLERGIES: Septra and Sulfonamide derivatives  Family History  Problem Relation Age of Onset  . Diabetes Mother   . Heart murmur Mother   . Glaucoma Mother   . COPD Mother   . Hypertension Sister   . Hypertension Sister   . Hypertension Sister   . Prostate cancer Father     History   Social History  . Marital Status: Married    Spouse Name: N/A    Number of Children: 1  . Years of Education: N/A   Occupational History  . [place] History Main Topics  . Smoking status: Never Smoker   . Smokeless tobacco: Never Used  . Alcohol Use: 2.0 oz/week    4 drink(s) per week     Comment: 1 per week   . Drug Use: No  . Sexual Activity: Yes    Birth Control/ Protection: IUD   Other Topics Concern  . Not on file   Social History Narrative  . No narrative on file    ROS:  Pertinent items are noted in HPI.  PHYSICAL EXAMINATION:    BP 128/70   Pulse 70  Ht 5\' 5"  (1.651 m)  Wt 152 lb (68.947 kg)  BMI 25.29 kg/m2     General appearance: alert, cooperative and appears stated age  Pelvic: External genitalia:  no lesions              Urethra:  normal appearing urethra with no masses, tenderness or lesions              Bartholins and Skenes: normal                 Vagina: normal appearing vagina with normal color and discharge, no lesions              Cervix: normal appearance                   Bimanual Exam:  Uterus:  uterus is normal size, shape, consistency and nontender                                      Adnexa: normal adnexa in size, nontender and no masses                             IUD removal -  Consent for removal.     Speculum placed in vagina. IUD strings noted from the external os.  Ring forceps used to remove IUD. IUD felt imbeded and more difficult to remove than usual. IUD confirmed to be intact. (Patient states she felt it on the left side.)     Ibuprofen 600 mg given to patient. No complications.     ASSESSMENT  Mirena IUD removal successful. HRT patient.   PLAN  I discussed HRT - risks and benefits with patient. She will stop Estrace and do a trial off HRT. She will call back if she has vasomotor symptoms and would like to reinitiate therapy. She understands that she cannot take Estrace alone due to risk of endometrial cancer with unopposed estrogen. Follow for annual exam in June 2015 with [facility].  Return prn.  An After Visit Summary was printed and given to the patient.  _15_____ minutes face to face time of which over 50% was spent in counseling.

## 2013-07-17 ENCOUNTER — Encounter: Payer: Self-pay | Admitting: Nurse Practitioner

## 2013-08-24 ENCOUNTER — Ambulatory Visit: Payer: 59 | Admitting: Nurse Practitioner

## 2013-08-29 ENCOUNTER — Ambulatory Visit: Payer: 59 | Admitting: Nurse Practitioner

## 2013-09-02 ENCOUNTER — Other Ambulatory Visit: Payer: Self-pay | Admitting: Nurse Practitioner

## 2013-09-04 NOTE — Telephone Encounter (Signed)
S/w patient she has AEX 09/05/13 with Ms. Patty she has enough to last her.

## 2013-09-05 ENCOUNTER — Ambulatory Visit (INDEPENDENT_AMBULATORY_CARE_PROVIDER_SITE_OTHER): Payer: 59 | Admitting: Nurse Practitioner

## 2013-09-05 ENCOUNTER — Encounter: Payer: Self-pay | Admitting: Nurse Practitioner

## 2013-09-05 VITALS — BP 130/86 | HR 68 | Resp 18 | Ht 65.75 in | Wt 152.0 lb

## 2013-09-05 DIAGNOSIS — N951 Menopausal and female climacteric states: Secondary | ICD-10-CM

## 2013-09-05 DIAGNOSIS — Z01419 Encounter for gynecological examination (general) (routine) without abnormal findings: Secondary | ICD-10-CM

## 2013-09-05 DIAGNOSIS — Z0000 Encounter for general adult medical examination without abnormal findings: Secondary | ICD-10-CM

## 2013-09-05 MED ORDER — ESTRADIOL 1 MG PO TABS: 1.0000 mg | ORAL_TABLET | Freq: Every day | ORAL | Status: DC

## 2013-09-05 MED ORDER — VALACYCLOVIR HCL 500 MG PO TABS: 500.0000 mg | ORAL_TABLET | Freq: Every day | ORAL | Status: AC

## 2013-09-05 MED ORDER — MEDROXYPROGESTERONE ACETATE 5 MG PO TABS: 5.0000 mg | ORAL_TABLET | Freq: Every day | ORAL | Status: AC

## 2013-09-05 MED ORDER — ALUMINUM CHLORIDE 20 % EX SOLN: Freq: Every day | CUTANEOUS | Status: AC

## 2013-09-05 MED ORDER — ESTRADIOL 1 MG PO TABS: 0.5000 mg | ORAL_TABLET | Freq: Every day | ORAL | Status: DC

## 2013-09-05 MED ORDER — ESTRADIOL 0.5 MG PO TABS: 0.5000 mg | ORAL_TABLET | Freq: Every day | ORAL | Status: AC

## 2013-09-05 NOTE — Patient Instructions (Signed)

## 2013-09-05 NOTE — Progress Notes (Signed)
53 y.o. G1P1 Married Caucasian Fe here for annual exam.  She plans to retire this fall.  [place].  After having her IUD removed in March, she went off Estradiol at the same time.  She is now having significant vaso symptoms and wants to restart as HRT.  Patient's last menstrual period was 10/10/2011.          Sexually active: Yes.    The current method of family planning is post menopausal status.    Exercising: Yes.    Home exercise routine includes Walking everyday. Smoker:  no  Health Maintenance: Pap:  08/23/12 Neg MMG:  05/2013 BIRADS1 Colonoscopy:  2010 TDaP:  2011 Labs: PCP   reports that she has never smoked. She has never used smokeless tobacco. She reports that she drinks about 2 - 2.5 ounces of alcohol per week. She reports that she does not use illicit drugs.  Past Medical History  Diagnosis Date  . HYPERTENSION 03/16/2007  . ALLERGIC RHINITIS 03/16/2007  . HYPERLIPIDEMIA 03/17/2010  . DEGENERATIVE JOINT DISEASE, LEFT KNEE 03/11/2009  . BACK PAIN 08/05/2007  . Migraine   . Menopausal syndrome     since 53 yo  . Arthritis   . Pneumonia   . Ischemic colitis 05/19/2011  . Abnormal Pap smear 1/05; 2/08; 07/2007    LGSIL Colpo Bx. CIN I  . STD (sexually transmitted disease)     HSV II  . 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 Vaginal delivery    . Intrauterine device insertion  05/25/08    Mirena  . Breast biopsy Right 06/08/11    Fibroadenoma  . Iud removal  06/05/13    Current Outpatient Prescriptions  Medication Sig Dispense Refill  . aluminum chloride (DRYSOL) 20 % external solution Apply topically at bedtime.  35 mL  5  . aspirin 81 MG EC tablet Take 81 mg by mouth daily.        . Biotin (BIOTIN 5000) 5 MG CAPS Take by mouth 2 (two) times daily.      . Calcium  Carb-Cholecalciferol (CALCIUM 1000 + D PO) Take by mouth daily.        . Cyanocobalamin (VITAMIN B-12 CR PO) Take by mouth daily.        . Multiple Vitamins-Minerals (MULTIVITAMIN,TX-MINERALS) tablet Take 1 tablet by mouth daily.        Marland Kitchen olmesartan (BENICAR) 40 MG tablet TAKE 1/2 TABLET DAILY  90 tablet  3  . Omega-3 Fatty Acids (FISH OIL PO) Take by mouth daily.        Marland Kitchen triamcinolone cream (KENALOG) 0.1 % Apply 1 application topically 2 (two) times daily.      . valACYclovir (VALTREX) 500 MG tablet Take 1 tablet (500 mg total) by mouth daily.  90 tablet  3  . vitamin C (ASCORBIC ACID) 500 MG tablet Take 500 mg by mouth daily.        . vitamin E 100 UNIT capsule Take 100 Units by mouth daily.        Marland Kitchen estradiol (ESTRACE) 0.5 MG tablet Take 1 tablet (0.5 mg total) by mouth daily.  90 tablet  3  . medroxyPROGESTERone (PROVERA) 5 MG tablet Take 1 tablet (5 mg total) by mouth daily.  90 tablet  3   No current  facility-administered medications for this visit.    Family History  Problem Relation Age of Onset  . Diabetes Mother   . Heart murmur Mother   . Glaucoma Mother   . COPD Mother   . Hypertension Sister   . Hypertension Sister   . Hypertension Sister   . Prostate cancer Father     ROS:  Pertinent items are noted in HPI.  Otherwise, a comprehensive ROS was negative.  Exam:   BP 130/86  Pulse 68  Resp 18  Ht 5' 5.75" (1.67 m)  Wt 152 lb (68.947 kg)  BMI 24.72 kg/m2  LMP 10/10/2011 Height: 5' 5.75" (167 cm)  Ht Readings from Last 3 Encounters:  09/05/13 5' 5.75" (1.67 m)  06/05/13 5\' 5"  (1.651 m)  05/25/13 5\' 4"  (1.626 m)    General appearance: alert, cooperative and appears stated age Head: Normocephalic, without obvious abnormality, atraumatic Neck: no adenopathy, supple, symmetrical, trachea midline and thyroid normal to inspection and palpation Lungs: clear to auscultation bilaterally Breasts: normal appearance, no masses or tenderness Heart: regular rate and  rhythm Abdomen: soft, non-tender; no masses,  no organomegaly Extremities: extremities normal, atraumatic, no cyanosis or edema Skin: Skin color, texture, turgor normal. No rashes or lesions Lymph nodes: Cervical, supraclavicular, and axillary nodes normal. No abnormal inguinal nodes palpated Neurologic: Grossly normal   Pelvic: External genitalia:  no lesions              Urethra:  normal appearing urethra with no masses, tenderness or lesions              Bartholin's and Skene's: normal                 Vagina: normal appearing vagina with normal color and discharge, no lesions              Cervix: anteverted              Pap taken: Yes.   Bimanual Exam:  Uterus:  normal size, contour, position, consistency, mobility, non-tender              Adnexa: no mass, fullness, tenderness               Rectovaginal: Confirms               Anus:  normal sphincter tone, no lesions  A:  Well Woman with normal exam  Postmenopausal - will start on HRT   P:   Reviewed health and wellness pertinent to exam  Pap smear taken today  Mammogram is due 05/2014  Will reduce estradiol to 0.5 mg daily and add Provera 5 mg daily  Refill on Drysol  Counseled on breast self exam, mammography screening, use and side effects of HRT, adequate intake of calcium and vitamin D, diet and exercise, Kegel's exercises return annually or prn  An After Visit Summary was printed and given to the patient.

## 2013-09-05 NOTE — Progress Notes (Signed)
Encounter reviewed by Dr. Brook Silva.  

## 2013-09-06 LAB — ESTRADIOL: Estradiol: <11.8 pg/mL

## 2013-09-11 LAB — IPS PAP TEST WITH HPV

## 2013-09-13 ENCOUNTER — Telehealth: Payer: Self-pay | Admitting: Nurse Practitioner

## 2013-09-13 NOTE — Telephone Encounter (Signed)
Nothing Rx. Has she tried the OTC Clinical strength which is fairly comparable to Drysol

## 2013-09-13 NOTE — Telephone Encounter (Signed)
Spoke with patient. Advised of message as seen below from [place]. Patient states that she has not tried it but "Every deodorant that I have tried does not work. That is why she prescribed me the Drysol." Advised she could try this until rx is ready for pick up or that she could call other local pharmacies and see if they can fill rx faster. Patient states current pharmacy offered to send it to another pharmacy but "I am not trying to drive all over [facility] for deodorant." Advised patient if she calls other local pharmacies in her area before going she will be able to see if they can fill it. Advised rx will have to transferred to new pharmacy. Advised we can do that for patient or she can contact original pharmacy and have them transfer it. Patient agreeable and verbalizes understanding.  Routing to [place] as covering Cc: Milford Cage, FNP   Routing to provider for final review. Patient agreeable to disposition. Will close encounter

## 2013-09-13 NOTE — Telephone Encounter (Signed)
Patient said that patty sent her in a rx for Drysol to the pharmacy but the pharmacy said that the generic is no longer available and the brand is on back order for a long time. Wants to [facility] what she needs to do.

## 2013-09-13 NOTE — Telephone Encounter (Signed)
Regina Eck CNM is there an alternative medication comparable to Drysol that can be given to patient as the generic is no longer available and the brand is on back order. Please advise as Milford Cage, FNP is out of the office today. Thank you.  Routing to Regina Eck CNM as covering Cc: Milford Cage, FNP

## 2014-01-08 ENCOUNTER — Encounter: Payer: Self-pay | Admitting: Nurse Practitioner

## 2014-09-11 ENCOUNTER — Ambulatory Visit: Payer: 59 | Admitting: Nurse Practitioner

## 2015-10-01 NOTE — Progress Notes (Signed)
Opened in error
